# Patient Record
Sex: Male | Born: 1975
Health system: Southern US, Community
[De-identification: ages and names within clinical notes are randomized; demographics above are authoritative.]

## PROBLEM LIST (undated history)

## (undated) DIAGNOSIS — I4729 Other ventricular tachycardia (HCC): Principal | ICD-10-CM

## (undated) DIAGNOSIS — K529 Noninfective gastroenteritis and colitis, unspecified: Secondary | ICD-10-CM

## (undated) DIAGNOSIS — K5090 Crohn's disease, unspecified, without complications: Secondary | ICD-10-CM

## (undated) DIAGNOSIS — R739 Hyperglycemia, unspecified: Secondary | ICD-10-CM

## (undated) DIAGNOSIS — T380X5A Adverse effect of glucocorticoids and synthetic analogues, initial encounter: Secondary | ICD-10-CM

## (undated) DIAGNOSIS — E119 Type 2 diabetes mellitus without complications: Secondary | ICD-10-CM

## (undated) DIAGNOSIS — Z5189 Encounter for other specified aftercare: Secondary | ICD-10-CM

## (undated) HISTORY — DX: Crohn's disease, unspecified, without complications: K50.90

## (undated) HISTORY — DX: Type 2 diabetes mellitus without complications: E11.9

## (undated) HISTORY — DX: Noninfective gastroenteritis and colitis, unspecified: K52.9

## (undated) HISTORY — DX: Encounter for other specified aftercare: Z51.89

---

## 1998-01-14 HISTORY — PX: OTHER SURGICAL HISTORY: SHX169

## 2007-01-05 ENCOUNTER — Ambulatory Visit (HOSPITAL_BASED_OUTPATIENT_CLINIC_OR_DEPARTMENT_OTHER): Admission: RE | Admit: 2007-01-05 | Discharge: 2007-01-05 | Payer: Self-pay | Admitting: Surgery

## 2007-11-05 DIAGNOSIS — K5130 Ulcerative (chronic) rectosigmoiditis without complications: Secondary | ICD-10-CM | POA: Insufficient documentation

## 2007-11-06 ENCOUNTER — Ambulatory Visit: Payer: Self-pay | Admitting: Gastroenterology

## 2007-11-06 LAB — CONVERTED CEMR LAB
ALT: 15 units/L (ref 0–53)
AST: 17 units/L (ref 0–37)
Albumin: 3.3 g/dL — ABNORMAL LOW (ref 3.5–5.2)
Alkaline Phosphatase: 63 units/L (ref 39–117)
BUN: 9 mg/dL (ref 6–23)
Basophils Absolute: 0.1 10*3/uL (ref 0.0–0.1)
Basophils Relative: 1.4 % (ref 0.0–3.0)
CO2: 31 meq/L (ref 19–32)
Calcium: 8.5 mg/dL (ref 8.4–10.5)
Chloride: 107 meq/L (ref 96–112)
Creatinine, Ser: 0.9 mg/dL (ref 0.4–1.5)
Eosinophils Absolute: 0.8 10*3/uL — ABNORMAL HIGH (ref 0.0–0.7)
Eosinophils Relative: 14.2 % — ABNORMAL HIGH (ref 0.0–5.0)
GFR calc Af Amer: 127 mL/min
GFR calc non Af Amer: 105 mL/min
Glucose, Bld: 172 mg/dL — ABNORMAL HIGH (ref 70–99)
HCT: 35.6 % — ABNORMAL LOW (ref 39.0–52.0)
Hemoglobin: 12 g/dL — ABNORMAL LOW (ref 13.0–17.0)
Lymphocytes Relative: 32.5 % (ref 12.0–46.0)
MCHC: 33.8 g/dL (ref 30.0–36.0)
MCV: 82.7 fL (ref 78.0–100.0)
Monocytes Absolute: 0.6 10*3/uL (ref 0.1–1.0)
Monocytes Relative: 10.9 % (ref 3.0–12.0)
Neutro Abs: 2.2 10*3/uL (ref 1.4–7.7)
Neutrophils Relative %: 41 % — ABNORMAL LOW (ref 43.0–77.0)
Platelets: 304 10*3/uL (ref 150–400)
Potassium: 3.8 meq/L (ref 3.5–5.1)
RBC: 4.31 M/uL (ref 4.22–5.81)
RDW: 13.7 % (ref 11.5–14.6)
Sed Rate: 32 mm/hr — ABNORMAL HIGH (ref 0–16)
Sodium: 142 meq/L (ref 135–145)
Total Bilirubin: 0.6 mg/dL (ref 0.3–1.2)
Total Protein: 7.2 g/dL (ref 6.0–8.3)
WBC: 5.5 10*3/uL (ref 4.5–10.5)

## 2007-11-10 ENCOUNTER — Encounter: Payer: Self-pay | Admitting: Gastroenterology

## 2007-11-16 ENCOUNTER — Telehealth: Payer: Self-pay | Admitting: Gastroenterology

## 2007-11-24 ENCOUNTER — Telehealth: Payer: Self-pay | Admitting: Gastroenterology

## 2007-11-25 ENCOUNTER — Observation Stay (HOSPITAL_COMMUNITY)
Admission: AD | Admit: 2007-11-25 | Discharge: 2007-11-27 | Payer: Self-pay | Source: Home / Self Care | Admitting: Internal Medicine

## 2007-11-25 ENCOUNTER — Ambulatory Visit: Payer: Self-pay | Admitting: Gastroenterology

## 2007-11-25 ENCOUNTER — Ambulatory Visit: Payer: Self-pay | Admitting: Internal Medicine

## 2007-11-25 ENCOUNTER — Telehealth (INDEPENDENT_AMBULATORY_CARE_PROVIDER_SITE_OTHER): Payer: Self-pay | Admitting: *Deleted

## 2007-11-25 DIAGNOSIS — E1101 Type 2 diabetes mellitus with hyperosmolarity with coma: Secondary | ICD-10-CM | POA: Insufficient documentation

## 2007-11-25 LAB — CONVERTED CEMR LAB
ALT: 17 units/L (ref 0–53)
AST: 11 units/L (ref 0–37)
Albumin: 3.8 g/dL (ref 3.5–5.2)
Alkaline Phosphatase: 66 units/L (ref 39–117)
BUN: 21 mg/dL (ref 6–23)
Basophils Absolute: 0 10*3/uL (ref 0.0–0.1)
Basophils Relative: 0.3 % (ref 0.0–3.0)
Bilirubin, Direct: 0.1 mg/dL (ref 0.0–0.3)
CO2: 26 meq/L (ref 19–32)
Calcium: 8.8 mg/dL (ref 8.4–10.5)
Chloride: 96 meq/L (ref 96–112)
Creatinine, Ser: 1 mg/dL (ref 0.4–1.5)
Eosinophils Absolute: 0.2 10*3/uL (ref 0.0–0.7)
Eosinophils Relative: 2.7 % (ref 0.0–5.0)
GFR calc Af Amer: 113 mL/min
GFR calc non Af Amer: 93 mL/min
Glucose, Bld: 563 mg/dL (ref 70–99)
HCT: 39.1 % (ref 39.0–52.0)
Hemoglobin: 13.5 g/dL (ref 13.0–17.0)
Lymphocytes Relative: 30.8 % (ref 12.0–46.0)
MCHC: 34.5 g/dL (ref 30.0–36.0)
MCV: 83.6 fL (ref 78.0–100.0)
Monocytes Absolute: 0.7 10*3/uL (ref 0.1–1.0)
Monocytes Relative: 9.1 % (ref 3.0–12.0)
Neutro Abs: 4.4 10*3/uL (ref 1.4–7.7)
Neutrophils Relative %: 57.1 % (ref 43.0–77.0)
Platelets: 260 10*3/uL (ref 150–400)
Potassium: 3.9 meq/L (ref 3.5–5.1)
RBC: 4.67 M/uL (ref 4.22–5.81)
RDW: 15.3 % — ABNORMAL HIGH (ref 11.5–14.6)
Sodium: 133 meq/L — ABNORMAL LOW (ref 135–145)
Total Bilirubin: 1.5 mg/dL — ABNORMAL HIGH (ref 0.3–1.2)
Total Protein: 7.4 g/dL (ref 6.0–8.3)
WBC: 7.7 10*3/uL (ref 4.5–10.5)

## 2007-12-04 ENCOUNTER — Ambulatory Visit: Payer: Self-pay | Admitting: Gastroenterology

## 2007-12-04 DIAGNOSIS — E119 Type 2 diabetes mellitus without complications: Secondary | ICD-10-CM | POA: Insufficient documentation

## 2007-12-22 ENCOUNTER — Encounter: Payer: Self-pay | Admitting: Gastroenterology

## 2007-12-22 ENCOUNTER — Ambulatory Visit: Payer: Self-pay | Admitting: Gastroenterology

## 2007-12-25 ENCOUNTER — Encounter: Payer: Self-pay | Admitting: Gastroenterology

## 2008-01-04 ENCOUNTER — Telehealth: Payer: Self-pay | Admitting: Gastroenterology

## 2008-01-14 ENCOUNTER — Telehealth: Payer: Self-pay | Admitting: Gastroenterology

## 2008-01-15 ENCOUNTER — Telehealth: Payer: Self-pay | Admitting: Internal Medicine

## 2008-01-15 ENCOUNTER — Inpatient Hospital Stay (HOSPITAL_COMMUNITY): Admission: EM | Admit: 2008-01-15 | Discharge: 2008-01-17 | Payer: Self-pay | Admitting: Internal Medicine

## 2008-01-15 ENCOUNTER — Emergency Department (HOSPITAL_COMMUNITY): Admission: EM | Admit: 2008-01-15 | Discharge: 2008-01-15 | Payer: Self-pay | Admitting: Family Medicine

## 2008-01-15 ENCOUNTER — Ambulatory Visit: Payer: Self-pay | Admitting: Internal Medicine

## 2008-01-15 HISTORY — PX: HERNIA REPAIR: SHX51

## 2008-01-17 ENCOUNTER — Ambulatory Visit: Payer: Self-pay | Admitting: Internal Medicine

## 2008-01-19 ENCOUNTER — Telehealth (INDEPENDENT_AMBULATORY_CARE_PROVIDER_SITE_OTHER): Payer: Self-pay | Admitting: *Deleted

## 2008-01-19 ENCOUNTER — Telehealth: Payer: Self-pay | Admitting: Gastroenterology

## 2008-01-19 ENCOUNTER — Ambulatory Visit: Payer: Self-pay | Admitting: Gastroenterology

## 2008-01-20 LAB — CONVERTED CEMR LAB
ALT: 892 units/L — ABNORMAL HIGH (ref 0–53)
AST: 482 units/L — ABNORMAL HIGH (ref 0–37)
Albumin: 3 g/dL — ABNORMAL LOW (ref 3.5–5.2)
Alkaline Phosphatase: 476 units/L — ABNORMAL HIGH (ref 39–117)
BUN: 8 mg/dL (ref 6–23)
Bilirubin, Direct: 3.7 mg/dL — ABNORMAL HIGH (ref 0.0–0.3)
CO2: 26 meq/L (ref 19–32)
Calcium: 8.5 mg/dL (ref 8.4–10.5)
Chloride: 99 meq/L (ref 96–112)
Creatinine, Ser: 0.8 mg/dL (ref 0.4–1.5)
GFR calc Af Amer: 145 mL/min
GFR calc non Af Amer: 120 mL/min
Glucose, Bld: 124 mg/dL — ABNORMAL HIGH (ref 70–99)
HCT: 34 % — ABNORMAL LOW (ref 39.0–52.0)
Hemoglobin: 11.6 g/dL — ABNORMAL LOW (ref 13.0–17.0)
INR: 1 (ref 0.8–1.0)
MCHC: 34 g/dL (ref 30.0–36.0)
MCV: 89.5 fL (ref 78.0–100.0)
Platelets: 279 10*3/uL (ref 150–400)
Potassium: 3.8 meq/L (ref 3.5–5.1)
Prothrombin Time: 10.8 s — ABNORMAL LOW (ref 10.9–13.3)
RBC: 3.8 M/uL — ABNORMAL LOW (ref 4.22–5.81)
RDW: 18.9 % — ABNORMAL HIGH (ref 11.5–14.6)
Sodium: 136 meq/L (ref 135–145)
Total Bilirubin: 5.9 mg/dL — ABNORMAL HIGH (ref 0.3–1.2)
Total Protein: 6.1 g/dL (ref 6.0–8.3)
WBC: 6.6 10*3/uL (ref 4.5–10.5)

## 2008-01-22 ENCOUNTER — Ambulatory Visit: Payer: Self-pay | Admitting: Gastroenterology

## 2008-01-26 ENCOUNTER — Ambulatory Visit: Payer: Self-pay | Admitting: Gastroenterology

## 2008-01-26 LAB — CONVERTED CEMR LAB
ALT: 157 units/L — ABNORMAL HIGH (ref 0–53)
AST: 27 units/L (ref 0–37)
Albumin: 3.4 g/dL — ABNORMAL LOW (ref 3.5–5.2)
Alkaline Phosphatase: 231 units/L — ABNORMAL HIGH (ref 39–117)
BUN: 16 mg/dL (ref 6–23)
Basophils Absolute: 0.1 10*3/uL (ref 0.0–0.1)
Basophils Relative: 0.8 % (ref 0.0–3.0)
CO2: 30 meq/L (ref 19–32)
Calcium: 9.1 mg/dL (ref 8.4–10.5)
Chloride: 105 meq/L (ref 96–112)
Creatinine, Ser: 0.7 mg/dL (ref 0.4–1.5)
Eosinophils Absolute: 0 10*3/uL (ref 0.0–0.7)
Eosinophils Relative: 0.1 % (ref 0.0–5.0)
GFR calc Af Amer: 169 mL/min
GFR calc non Af Amer: 140 mL/min
Glucose, Bld: 154 mg/dL — ABNORMAL HIGH (ref 70–99)
HCT: 34 % — ABNORMAL LOW (ref 39.0–52.0)
Hemoglobin: 11.5 g/dL — ABNORMAL LOW (ref 13.0–17.0)
Lymphocytes Relative: 32.7 % (ref 12.0–46.0)
MCHC: 33.8 g/dL (ref 30.0–36.0)
MCV: 91.6 fL (ref 78.0–100.0)
Monocytes Absolute: 1.3 10*3/uL — ABNORMAL HIGH (ref 0.1–1.0)
Monocytes Relative: 15.8 % — ABNORMAL HIGH (ref 3.0–12.0)
Neutro Abs: 4 10*3/uL (ref 1.4–7.7)
Neutrophils Relative %: 50.6 % (ref 43.0–77.0)
Platelets: 360 10*3/uL (ref 150–400)
Potassium: 4 meq/L (ref 3.5–5.1)
RBC: 3.71 M/uL — ABNORMAL LOW (ref 4.22–5.81)
RDW: 18 % — ABNORMAL HIGH (ref 11.5–14.6)
Sodium: 140 meq/L (ref 135–145)
Total Bilirubin: 1.9 mg/dL — ABNORMAL HIGH (ref 0.3–1.2)
Total Protein: 6.7 g/dL (ref 6.0–8.3)
WBC: 8 10*3/uL (ref 4.5–10.5)

## 2008-02-03 ENCOUNTER — Telehealth (INDEPENDENT_AMBULATORY_CARE_PROVIDER_SITE_OTHER): Payer: Self-pay | Admitting: *Deleted

## 2008-02-04 ENCOUNTER — Ambulatory Visit: Payer: Self-pay | Admitting: Gastroenterology

## 2008-02-04 ENCOUNTER — Telehealth: Payer: Self-pay | Admitting: Gastroenterology

## 2008-02-08 ENCOUNTER — Ambulatory Visit: Payer: Self-pay | Admitting: Gastroenterology

## 2008-02-08 ENCOUNTER — Telehealth: Payer: Self-pay | Admitting: Gastroenterology

## 2008-02-09 LAB — CONVERTED CEMR LAB
ALT: 36 units/L (ref 0–53)
AST: 29 units/L (ref 0–37)
Albumin: 3.8 g/dL (ref 3.5–5.2)
Alkaline Phosphatase: 94 units/L (ref 39–117)
Bilirubin, Direct: 0.4 mg/dL — ABNORMAL HIGH (ref 0.0–0.3)
Total Bilirubin: 1.2 mg/dL (ref 0.3–1.2)
Total Protein: 6.9 g/dL (ref 6.0–8.3)

## 2008-02-23 ENCOUNTER — Ambulatory Visit: Payer: Self-pay | Admitting: Gastroenterology

## 2008-03-07 ENCOUNTER — Telehealth: Payer: Self-pay | Admitting: Internal Medicine

## 2008-03-22 ENCOUNTER — Encounter (INDEPENDENT_AMBULATORY_CARE_PROVIDER_SITE_OTHER): Payer: Self-pay | Admitting: *Deleted

## 2008-05-20 ENCOUNTER — Ambulatory Visit: Payer: Self-pay | Admitting: Gastroenterology

## 2008-05-20 ENCOUNTER — Telehealth: Payer: Self-pay | Admitting: Gastroenterology

## 2008-05-23 LAB — CONVERTED CEMR LAB
ALT: 16 units/L (ref 0–53)
AST: 14 units/L (ref 0–37)
Albumin: 2.9 g/dL — ABNORMAL LOW (ref 3.5–5.2)
Alkaline Phosphatase: 64 units/L (ref 39–117)
BUN: 13 mg/dL (ref 6–23)
Basophils Absolute: 0 10*3/uL (ref 0.0–0.1)
Basophils Relative: 0.7 % (ref 0.0–3.0)
CO2: 30 meq/L (ref 19–32)
Calcium: 8.2 mg/dL — ABNORMAL LOW (ref 8.4–10.5)
Chloride: 104 meq/L (ref 96–112)
Creatinine, Ser: 0.8 mg/dL (ref 0.4–1.5)
Eosinophils Absolute: 1.1 10*3/uL — ABNORMAL HIGH (ref 0.0–0.7)
Eosinophils Relative: 17.9 % — ABNORMAL HIGH (ref 0.0–5.0)
GFR calc non Af Amer: 119.58 mL/min (ref >60)
Glucose, Bld: 153 mg/dL — ABNORMAL HIGH (ref 70–99)
HCT: 25.7 % — ABNORMAL LOW (ref 39.0–52.0)
Hemoglobin: 8.4 g/dL — ABNORMAL LOW (ref 13.0–17.0)
Lymphocytes Relative: 31.5 % (ref 12.0–46.0)
Lymphs Abs: 1.9 10*3/uL (ref 0.7–4.0)
MCHC: 32.7 g/dL (ref 30.0–36.0)
MCV: 80.9 fL (ref 78.0–100.0)
Monocytes Absolute: 1 10*3/uL (ref 0.1–1.0)
Monocytes Relative: 16 % — ABNORMAL HIGH (ref 3.0–12.0)
Neutro Abs: 2 10*3/uL (ref 1.4–7.7)
Neutrophils Relative %: 33.9 % — ABNORMAL LOW (ref 43.0–77.0)
Platelets: 396 10*3/uL (ref 150.0–400.0)
Potassium: 3.8 meq/L (ref 3.5–5.1)
RBC: 3.18 M/uL — ABNORMAL LOW (ref 4.22–5.81)
RDW: 13.2 % (ref 11.5–14.6)
Sodium: 140 meq/L (ref 135–145)
Total Bilirubin: 0.5 mg/dL (ref 0.3–1.2)
Total Protein: 7 g/dL (ref 6.0–8.3)
WBC: 6 10*3/uL (ref 4.5–10.5)

## 2008-06-20 ENCOUNTER — Ambulatory Visit: Payer: Self-pay | Admitting: Gastroenterology

## 2008-06-22 ENCOUNTER — Telehealth: Payer: Self-pay | Admitting: Gastroenterology

## 2008-09-27 ENCOUNTER — Ambulatory Visit: Payer: Self-pay | Admitting: Gastroenterology

## 2008-09-30 ENCOUNTER — Telehealth: Payer: Self-pay | Admitting: Gastroenterology

## 2008-11-07 ENCOUNTER — Encounter (INDEPENDENT_AMBULATORY_CARE_PROVIDER_SITE_OTHER): Payer: Self-pay | Admitting: *Deleted

## 2009-01-02 ENCOUNTER — Encounter: Payer: Self-pay | Admitting: Gastroenterology

## 2009-01-31 ENCOUNTER — Ambulatory Visit: Payer: Self-pay | Admitting: Gastroenterology

## 2010-02-13 NOTE — Progress Notes (Signed)
Summary: RX SIDE EFFECT  Phone Note Call from Patient Call back at Home Phone [phone]   Caller: Patient Call For: JACOBS Reason for Call: Talk to Nurse Action Taken: Appt Scheduled Today Summary of Call: PT [place].... Initial call taken by: Ronalee Red,  November 24, 2007 9:39 AM  Follow-up for Phone Call        Pt. has colon on Friday but is again expressing concerns about side effects of Prednisone which was decreased on 11/16/07 to 10 mg. two times a day.from 20 mg.bid.Still has vision changes and has wt. loss of 14 lbs since ov on 11/06/07 Follow-up by: Abel Presto RN,  November 24, 2007 10:48 AM  Additional Follow-up for Phone Call Additional follow up Details #1::        back down a bit more (to 23m in am, then 548min pm).  ask him about how his bowels are doing.  he needs to come in for labs (cbc, cmet) today or tomorrow. Additional Follow-up by: DaMilus BanisterD,  November 24, 2007 11:15 AM    Additional Follow-up for Phone Call Additional follow up Details #2::    Pt. instructed to decrease Prednisone dose and he will come for labs tommorrow per Dr.Jacobs Follow-up by: ChAbel PrestoN,  November 24, 2007 2:07 PM    Appended Document: RX SIDE EFFECT Pt.says he is having 3-4 formed stools a day and has seen only a slight increase in mucus in stools and no blood since he has been tapering Prednisone

## 2010-02-13 NOTE — Progress Notes (Signed)
Summary: Fever and feels sick  Phone Note Call from Patient Call back at Home Phone [phone]   Call For: Dr Ardis Hughs Reason for Call: Talk to Nurse Summary of Call: Has a fever and feels sick would like to speak to nurse please. Initial call taken by: Irwin Brakeman [facility],  January 04, 2008 12:39 PM  Follow-up for Phone Call        Pt. has felt "achy" the past 2-3 days started running temp.last pm.this am is 101.5.Having about 3 loose stools each am. is seeing sm. amt of brb. in stools.Denies pain or cramping.Has appt. with pcp for tomorrow morning Follow-up by: Abel Presto RN,  January 04, 2008 12:51 PM  Additional Follow-up for Phone Call Additional follow up Details #1::        fever is an unusual side effect of sulfasalazine.  Maybe the flu?  Tell him to have his PCP page me tomorrow if he does think the symptoms are GI related. Additional Follow-up by: Milus Banister MD,  January 04, 2008 2:34 PM    Additional Follow-up for Phone Call Additional follow up Details #2::    Pt. aware of DR.Jacobs f/u suggestions Follow-up by: Abel Presto RN,  January 04, 2008 2:46 PM

## 2010-02-13 NOTE — Progress Notes (Signed)
Summary: results  Phone Note Call from Patient Call back at Home Phone [phone]   Caller: Patient Call For: JACOBS Reason for Call: Lab or Test Results, Insurance Question Action Taken: Appt Scheduled Today Details for Reason: results Summary of Call: would like Lab results Initial call taken by: Quenton Fetter [facility],  February 08, 2008 3:18 PM    Additional Follow-up for Phone Call Additional follow up Details #2::    LFT's revd. with pt.and he was informed that if Dr.has any new orders I will call him back Follow-up by: Abel Presto RN,  February 08, 2008 4:30 PM

## 2010-02-13 NOTE — Assessment & Plan Note (Signed)
GI problem list: 1. 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  Mesalamine products did not seem to help very well.  Significant flare summer 2009 steroids started. Had extreme hyperglycemia on steroids requiring insulin.  Repeat colonoscopy December, 2009 showed inflammation moderate To 70 cm. He was started on sulfasalazine. Had drug rash, likely pneumonitis, LFT abnormalities ( transaminases up to almost 1000, bili up to 6).  Holding his medicines resulted in complete resolution of abnormal labs checked serially.  TPMT testing January, 2010 showed normal enzyme activity. February, 2010 tapered off all medicines and feels very well.  May, 2010: flaring of symptoms for the past month, He went to see a [place] herbalist, did not start the oral mesalamine that I recommended, restarted prednisone.   History of Present Illness Visit Type: Follow-up Visit Primary GI MD: Owens Loffler MD Primary Provider: Odis Luster, MD Chief Complaint: Follow-up Visit History of Present Illness:      I last saw Ostin 3 months ago. At that time he was going to begin oral mesalamine. He was also due to start cord enemas. He really didn't do either of those therapies. Seems like he stay on his 10 mg twice daily of prednisone for about 2 months and then started to taper down recently.   currently on 22m two times a day.  Was on 10 mg twice a day about 2 weeks ago.  Shingles developed left upper quadrant left back (started about a week ago).  Has been doing cortenemas the past few days.  Does these at night before bedtime.  Has been seeing a [place] for the past 2-3 months., drinking a tea of some sort that he believes is helping.  With all this, his bowels are "good":  no bleeding, intermittent diarrhea only but nothing severe.           Current Medications (verified): 1)  Multivitamins   Tabs (Multiple Vitamin) ..Marland Kitchen. 1 Tablet By Mouth Once Daily 2)  Onetouch Ultrasoft Lancets   Misc (Lancets) .... Use Up To Four Times A Day 3)  Prednisone 5 Mg Tabs (Prednisone) .... Take Two By Mouth Once Daily 4)  Lantus 100 Unit/ml Soln (Insulin Glargine) .... Take As Directed 5)  Novolog 100 Unit/ml Soln (Insulin Aspart) .... Take As Directed 6)  Cortenema 100 Mg/685mEnem (Hydrocortisone) ...Marland Kitchen 1 Rectally At Bedtime As Needed  Allergies (verified): 1)  ! * Avelox 2)  ! Sulfa  Vital Signs:  Patient profile:   [age]ear old male Height:      73 inches Weight:      166.8 pounds Pulse rate:   86 / minute Pulse rhythm:   regular BP sitting:   140 / 70  (right arm) Cuff size:   regular  Vitals Entered By: LeBernita BuffyMA ([place](September 27, 2008 2:16 PM)  Physical Exam  Additional Exam:  Constitutional: generally well appearing Psychiatric: alert and oriented times 3 Abdomen: soft, non-tender, non-distended, normal bowel sounds    Impression & Recommendations:  Problem # 1:  left-sided colitis ScArva Chafeeems to manage his disease on his own. I cannot fault him for seeing  [place] and he truly believes that his symptoms have improved since starting this she mixture her it indeed that may be the case. He seems very busy with his work and I think he feels overall good control as long as he is on steroids and perhaps that is part of his reluctance to change to oral mesalamine  products. He has also had significant medicine reactions and that undoubtedly contributes.  For and I recommended that he cut back on his prednisone by 2 and half milligrams per week. Explained that the cortenema could be used chronically and that may keep his colitis in remission.  he'll continue that nightly.  Patient Instructions: 1)  Continue backing off the prednisone by 2.43m a week. 2)  Continue the cortenemmas nightly. 3)  Return to see Dr. JArdis Hughsin 3 months, sooner if needed. 4)  The medication list was reviewed and reconciled.  All changed / newly prescribed medications were  explained.  A complete medication list was provided to the patient / caregiver.

## 2010-02-13 NOTE — Letter (Signed)
Summary: Office Visit Letter  [facility]  [address] [place], [place] [address]   Phone: [phone]  Fax: [phone]      November 07, 2008 MRN: [id number]   Kenneth [name] [address] [place], [place]  [address]   Dear [name],   According to our records, it is time for you to schedule a follow-up office visit with [place] in the month of December 2010.   At your convenience, please call [phone] (option #2)to schedule an office visit. If you have any questions, concerns, or feel that this letter is in error, we would appreciate your call.   Sincerely,  Milus Banister, M.D.   [facility] Gastroenterology Division [phone]

## 2010-02-13 NOTE — Progress Notes (Signed)
Summary: medication ?  Phone Note Call from Patient Call back at [facility] Phone [phone]   Caller: Patient Call For: Dr. Ardis Hughs Reason for Call: Talk to Nurse Details for Reason: medication ? Summary of Call: has ?'s regarding medication "Cortennema"... pt found this medication online and would like an rx for it Initial call taken by: Lucien Mons,  June 22, 2008 12:08 PM  Follow-up for Phone Call        spoke with pt he was looking at the internet and found [place] and was wondering if this would be an option for him.  Christian Mate CMA  June 22, 2008 1:12 PM  Additional Follow-up for Phone Call Additional follow up Details #1::        yes please call in cortenema, once nightly before bed, give him 30 with 6 refills Additional Follow-up by: Milus Banister MD,  June 22, 2008 4:59 PM    New/Updated Medications: CORTENEMA 100 MG/60ML ENEM (HYDROCORTISONE) 1 rectally at bedtime   Prescriptions: CORTENEMA 100 MG/60ML ENEM (HYDROCORTISONE) 1 rectally at bedtime  #30 x 6   Entered by:   Christian Mate CMA   Authorized by:   Milus Banister MD   Signed by:   Christian Mate CMA on 06/22/2008   Method used:   Electronically to        [facility] (retail)       [place], [place]  [address]       Ph: [phone]       Fax: [phone]   RxID:   [id number]

## 2010-02-13 NOTE — Progress Notes (Signed)
----   Converted from flag ---- ---- 11/25/2007 12:39 PM, Rachael Fee MD wrote: yes, he needs to be seen by one of our PCPs today.  Urgent add on for steroid induced hyperglycemia, possible underlying diabetes.  if you don't get anywhere on getting him sqeezed in ASAP let me know...page me  dj  ---- 11/25/2007 10:59 AM, Chales Abrahams CMA wrote: Dr Christella Hartigan the lab called with a critical Glucose of 563 for [place].  Do you want me to do anything? ------------------------------  Phone Note Outgoing Call   Call placed by: Chales Abrahams CMA,  November 25, 2007 12:47 PM Summary of Call: left message on machine to call back pt needs asap pcp appt could not get pt will call back.  also left message at [facility] for appt.  Follow-up for Phone Call        pt returned call he agrees to see PCP , I am waiting on a call from [facility] for appt will call him when I get the time. Chales Abrahams CMA  November 25, 2007 12:51 PM Follow-up by: Rachael Fee MD,  November 25, 2007 1:25 PM  Additional Follow-up for Phone Call Additional follow up Details #1::        ok, let me know Additional Follow-up by: Rachael Fee MD,  November 25, 2007 1:25 PM    Additional Follow-up for Phone Call Additional follow up Details #2::    Pt. ntfd. that Dr.Jones in [facility] will see the pt. today at 2:15 p.m. Follow-up by: Teryl Lucy RN,  November 25, 2007 1:00 PM

## 2010-02-13 NOTE — Letter (Signed)
Summary: Results Letter  [facility]  [place], [place] [address]   Phone: [phone]  Fax: [phone]        December 25, 2007 MRN: [id number]    Kenneth [name] [address] [place],   [address]    Dear Kenneth [name],  The biopsies taken during your recent colonoscopy showed chronic inflammation without any signs of infection or cancer.  You should continue to follow the recommnedations that we discussed at the time of your procedure.  Please feel free to call if you have any further questions or concerns.      Sincerely,  Milus Banister MD  This letter has been electronically signed by your physician.

## 2010-02-13 NOTE — Progress Notes (Signed)
Summary: ON CALL / NEW JAUNDICE  Phone Note Call from Patient   Caller: Patient Call For: DR JACOBS Details for Reason: JAUNDICE Summary of Call: CHART REVIEWED.  CALLED BY PATIENT RE JAUNDICE NOTICED PAST DAY OR TWO. NEW DIABETIC. SEEING DR JACOBS FOR  LEFT SIDED UC. HAS BEEN ON LOW DOSE PREDNISONE. RECENTLY STARTED ON SULFASALAZINE. NO ACTIVE COLITIS SYMPTOMS. RECENTLY DEVELOPED PNEUMONIA AND WAS TREATED WITH AVELOX. DEVELOPED A RASH YESTERDAY. RASH WORSE TODAY. SWITCHED TO BIAXIN BY PCP.  HAS BEEN HAVING FEVERS. USES TYLENOL. OTHERWISE FEELS OK. NO PAIN, LETHERGY, OR CHANGE IN MENTAL STATUS. DR MOHAMAD (WIFE IS HIS NURSE) AT HOUSE AN CONFIRMS PATIENT IS JAUNDICED, BUT O/W LOOKS OK. I RECOMMENDED THAT HE GO TO [facility] FOR EVAL AND LABS. HE AGREES. Initial call taken by: Irene Shipper MD,  January 15, 2008 9:54 PM  Follow-up for Phone Call        SPOKE TO PA IN ER. PATIENT LOOKS JAUNDICED BUT O/W FINE W/ NORMAL VITALS AND NEURO EXAM. TRANSAMINASES 500 - 1000 RANGE. BILI 5.6. PT IS OK. ACUTE HEP PANEL SENT.   SUSPECT DRUG REACTION WITH RASH, FEVER, AND DRUG INDUCED HEPATITIS. TOLD TO STOP ALL ANTIBIOTICS AND SULFASALAZINE. TOLD TO COME TO OUR OFFICE EARLY AM 01-18-08 FOR REPEAT LABS. ALSO, TOLD TO CONTACT ME OVER THE WEEKEND IF ANY NEW OR WORSENING PROBLEMS.  PCP IS DR Maylon Peppers. Follow-up by: Irene Shipper MD,  January 15, 2008 10:02 PM  Additional Follow-up for Phone Call Additional follow up Details #1::        CHERYL, [place] LFT'S, PT/INR FIRST THING1-4-10. PLEASE ARRANGE AND FORWARD RESULTS TO DR JACOBS. Additional Follow-up by: Irene Shipper MD,  January 15, 2008 10:04 PM      Appended Document: ON CALL / [place]. NO NEED TO PUT IN LABS FOR 01-18-08.  Appended Document: ON CALL / [facility] admission noted

## 2010-02-13 NOTE — Assessment & Plan Note (Signed)
GI problem list: 1. 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  Mesalamine products did not seem to help very well.  Significant flare summer 2009 steroids started. Had extreme hyperglycemia on steroids requiring insulin.  Repeat colonoscopy December, 2009 showed inflammation moderate To 70 cm. He was started on sulfasalazine. Had drug rash, likely pneumonitis, LFT abnormalities ( transaminases up to almost 1000, bili up to 6).  Holding his medicines resulted in complete resolution of abnormal labs checked serially.  TPMT testing January, 2010 showed normal enzyme activity. February, 2010 tapered off all medicines and feels very well.  May, 2010: flaring of symptoms for the past month, started mesalamine orally and rectally, restarted prednisone.    History of Present Illness Visit Type: Follow-up Visit Primary GI MD: Owens Loffler MD Primary Aryon Nham: Odis Luster, MD Chief Complaint: follow-up visit History of Present Illness:     very pleasant 35 year old man who was last seen about one month ago.  he picked up the canasa suppositories and read it over, was nervous about the sulfa.  He did not start them or the asacol.  his hemoglobin last month was 8.4 and he therefore started iron supplements daily. He has noticed a great increase in his energy level, color of skin is more normal.  he is taking pednisone 52m twice a day.  Quickly he had blood sugar issues.  Since starting steroids, bleeding has stopped.  Still has mild urgency, has 2-3 BMs in AM.           Current Medications (verified): 1)  Multivitamins   Tabs (Multiple Vitamin) ..Marland Kitchen. 1 Tablet By Mouth Once Daily 2)  Onetouch Ultrasoft Lancets  Misc (Lancets) .... Use Up To Four Times A Day 3)  Prednisone 10 Mg Tabs (Prednisone) .... Take As Directed 4)  Lantus 100 Unit/ml Soln (Insulin Glargine) .... Take As Directed 5)  Novolog 100 Unit/ml Soln (Insulin Aspart) .... Take As Directed  Allergies  (verified): 1)  ! * Avelox 2)  ! Sulfa  Vital Signs:  Patient profile:   35year old male Height:      73 inches Weight:      163 pounds BMI:     21.58 Pulse rate:   84 / minute Pulse rhythm:   regular BP sitting:   126 / 70  (left arm)  Vitals Entered By: BRandye LoboCMA (June 20, 2008 9:33 AM)  Physical Exam  Additional Exam:  Constitutional: generally well appearing Psychiatric: alert and oriented times 3 Abdomen: soft, non-tender, non-distended, normal bowel sounds    Impression & Recommendations:  Problem # 1:  left-sided colitis he was nervous about having side effects from the Canasa suppositories as well as the asacol however I discussed with him the fact that he had been on mesalamine previously under the care of Dr. GPenelope Coopand never had problems with mesalamine. Think it is reasonable however to change him to rectal steroid suppositories. He will on the other hand begin taking the oral mesalamine. She will continue on prednisone 20 mg a day and will return to see me in one months time.  Patient Instructions: 1)  Return to see Dr. JArdis Hughsin 4-5 weeks. 2)  Start the Asacol. 3)  Will call in streoid suppositories. 4)  Continue prednisone at 10 twice daily until next appt. 5)  The medication list was reviewed and reconciled.  All changed / newly prescribed medications were explained.  A complete medication list was provided to the patient /  caregiver.  Appended Document:  please call [place], the only steroid  suppositories avail are really just for hemorhroids.  he can either strat the canasa suppositories (i think this is safe) or simply NOT do suppositories (probably ok since he's already better with the steroids.  Appended Document:  pt aware

## 2010-02-13 NOTE — Miscellaneous (Signed)
Summary: rx  Clinical Lists Changes  Medications: Added new medication of SULFAZINE 500 MG  TABS (SULFASALAZINE) take 3 pills, three times a day - Signed Rx of SULFAZINE 500 MG  TABS (SULFASALAZINE) take 3 pills, three times a day;  #270 x 3;  Signed;  Entered by: Milus Banister MD;  Authorized by: Milus Banister MD;  Method used: Electronically to [facility] [address], [place], [place], [place]  [address], Ph: [phone], Fax: [phone]    Prescriptions: SULFAZINE 500 MG  TABS (SULFASALAZINE) take 3 pills, three times a day  #270 x 3   Entered and Authorized by:   Milus Banister MD   Signed by:   Milus Banister MD on 12/22/2007   Method used:   Electronically to        [facility] (retail)       [place], [place]  [address]       Ph: [phone]       Fax: [phone]   RxID:   [id number]

## 2010-02-13 NOTE — Progress Notes (Signed)
Summary: rx called in  Phone Note Call from Patient Call back at Home Phone [phone]   Caller: Patient Call For: Kenneth [name] Reason for Call: Talk to Nurse Summary of Call: Patient would like pain meds called in for shingles to [facility] on [address]  Initial call taken by: Ronalee Red,  September 30, 2008 10:00 AM  Follow-up for Phone Call        pt was called and explained that he needs to call his primary care Dr for pain meds for Shingles.  pt agreed.  Follow-up by: Christian Mate CMA ([place]),  September 30, 2008 10:06 AM

## 2010-02-13 NOTE — Progress Notes (Signed)
Summary: talk to nurse  Phone Note Call from Patient Call back at Home Phone [phone]   Call For: Dr Ardis Hughs Reason for Call: Talk to Nurse Summary of Call: Wants to discuss his Steroids with nurse. Initial call taken by: Irwin Brakeman [facility],  January 14, 2008 10:49 AM  Follow-up for Phone Call        Pt. was diagnosed with pneumonia on 01/04/08 and says I just can't shake  it.Colitis is under control not having any pain or bleeding asking if he can start to taper Prednisone is on 10 mg. b.i.d and thinks that is why his pneumonia isn't getting better.Has f/u appt. with Dr.Jacobs on 01/22/08 Follow-up by: Abel Presto RN,  January 14, 2008 11:15 AM  Additional Follow-up for Phone Call Additional follow up Details #1::        if he has any concerns regarding pneumonia, he should see his PCP ASAP. He could decrease his prednisone 10 mg daily. He should keep his followup with Dr. Ardis Hughs next week. Additional Follow-up by: Irene Shipper MD,  January 14, 2008 11:47 AM    Additional Follow-up for Phone Call Additional follow up Details #2::    Pt. ntfd. of DR.Perry's orders Follow-up by: Abel Presto RN,  January 14, 2008 11:56 AM

## 2010-02-13 NOTE — Progress Notes (Signed)
Summary: results  Phone Note Call from Patient Call back at Home Phone [phone]   Caller: Patient Call For: JACOBS  Reason for Call: Lab or Test Results Details for Reason: results  Summary of Call: wondering if Lab results were in yet? Initial call taken by: Quenton Fetter [facility],  January 19, 2008 4:44 PM  Follow-up for Phone Call        Lab results not back yet.Pt.aware will call pt. when complete and rev'd by Dr.Jacobs Follow-up by: Abel Presto RN,  January 19, 2008 5:01 PM      Appended Document: results message left to cb for lab results  Appended Document: results Pt. ntfd. of lab results

## 2010-02-13 NOTE — Procedures (Signed)
Summary: colon prep/[facility]  colon prep/[facility]   Imported By: Bubba Hales 11/13/2007 08:53:59  _____________________________________________________________________  External Attachment:    Type:   Image     Comment:   External Document

## 2010-02-13 NOTE — Progress Notes (Signed)
Summary: SPEAK TO NURSE  Phone Note Call from Patient Call back at Home Phone [phone]   Caller: Patient Call For: JACOBS Reason for Call: Talk to Nurse Summary of Call: PT TAKING [place] EFFECT Initial call taken by: Ronalee Red,  November 16, 2007 9:34 AM  Follow-up for Phone Call        Is on Prednisone20 mg. two times a day since 11/06/07 which started working almost immediately. Is having formed b.m.'s with some mucus but no blood.Denies pain.Is concerned about his vision is having trouble focusing far away( any thing past 10 feet ) Follow-up by: Abel Presto RN,  November 16, 2007 10:44 AM  Additional Follow-up for Phone Call Additional follow up Details #1::        probably from prednisone, he should cut back to 71m twice a day, otherwise continue as discussed. Additional Follow-up by: DMilus BanisterMD,  November 16, 2007 11:03 AM    Additional Follow-up for Phone Call Additional follow up Details #2::    Pt. ntfd. of Dr.Jacobs orders to decrease Prednisone reminded to call if symptoms re- occur Follow-up by: CAbel PrestoRN,  November 16, 2007 12:11 PM

## 2010-02-13 NOTE — Letter (Signed)
Summary: Recall-Office Visit Letter  [facility]  [place], [place] [address]   Phone: [phone]  Fax: [phone]      March 22, 2008 MRN: [id number]   [facility] [facility] [address] [place], [place]  [address]   Dear Mr. Boehringer,   According to our records, it is time for you to schedule a follow-up office visit with [place] in the month of April 2010.   At your convenience, please call [phone] (option #2)to schedule an office visit. If you have any questions, concerns, or feel that this letter is in error, we would appreciate your call.   Sincerely,  Milus Banister, M.D.   [facility] Gastroenterology Division [phone]

## 2010-02-13 NOTE — Assessment & Plan Note (Signed)
GI problem list: 1. 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  Mesalamine products did not seem to help very well.  Significant flare summer 2009 steroids started. Had extreme hyperglycemia on steroids requiring insulin.  Repeat colonoscopy December, 2009 showed inflammation moderate To 70 cm. He was started on sulfasalazine. Had drug rash, likely pneumonitis, LFT abnormalities ( transaminases up to almost 1000, bili up to 6).  Holding his medicines resulted in complete resolution of abnormal labs checked serially.  TPMT testing January, 2010 showed normal enzyme activity. February, 2010 tapered off all medicines and feels very well.  May, 2010: flaring of symptoms for the past month, He went to see a [place] herbalist, did not start the oral mesalamine that I recommended, restarted prednisone.  September, 2010 self-medicating with prednisone however tapering off. January, 2011, : doing well off traditional IBD medicines, taking [place] Herb.    History of Present Illness Visit Type: Follow-up Visit Primary GI MD: Owens Loffler MD Primary Provider: Odis Luster, MD Chief Complaint: ulcerative colitis History of Present Illness:     last seen 4-5 months ago. his bowels have been "great," almost like normal.  have 1-2 BMs a day.  solid, non-bloody.  No abd piains.  No diarrhea.  he has been on Aloe and a herbal Tea once daily.  Not using the enemas at all.  No steroids in several months.  the only thing he is conerned about.           Current Medications (verified): 1)  Multivitamins   Tabs (Multiple Vitamin) .Marland Kitchen.. 1 Tablet By Mouth Once Daily 2)  Onetouch Ultrasoft Lancets  Misc (Lancets) .... Use Up To Four Times A Day  Allergies (verified): 1)  ! * Avelox 2)  ! Sulfa  Vital Signs:  Patient profile:   35 year old male Height:      73 inches Weight:      167.50 pounds BMI:     22.18 Pulse rate:   72 / minute Pulse rhythm:   regular BP sitting:   114 / 70   (left arm) Cuff size:   regular  Vitals Entered By: June McMurray [place] Deborra Medina) (January 31, 2009 11:06 AM)  Physical Exam  Additional Exam:  Constitutional: generally well appearing Psychiatric: alert and oriented times 3 Abdomen: soft, non-tender, non-distended, normal bowel sounds    Impression & Recommendations:  Problem # 1:  Left-sided colitis symptomatically he sounds like he is doing quite well. He is on nutritional colitis  medicines, however he is taking shallow and a [place] herbal tea on a daily basis. It is not clear if these therapies are helping him or if he is just in cyclical remission that is not atypical of colitis. He will return to see me on an as-needed basis and he will forward his recent iron studies for my review. Does sound as if he was iron deficient and he started taking iron supplements for a while, now he is on multivitamin with iron in them.  Patient Instructions: 1)  Will get labs records from Dr. Conception Chancy, from [facility].  [place]. 2)  Return to see Dr. Ardis Hughs as needed. 3)  The medication list was reviewed and reconciled.  All changed / newly prescribed medications were explained.  A complete medication list was provided to the patient / caregiver.  Appended Document:  recieved faxed labs from [facility] dated 01/03/09: Hb 12.1, mcv normal but iron studies all low.  Please call him, I  agree with him being on daily iron supplements.  Appended Document:  left message on machine to call back   Appended Document:  pt aware

## 2010-02-13 NOTE — Assessment & Plan Note (Signed)
  GI problem list: 1. 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  Mesalamine products did not seem to help very well.  Significant flare summer 2009 steroids started. Had extreme hyperglycemia on steroids requiring insulin.     History of Present Illness Visit Type: follow up Primary GI MD: Owens Loffler MD Primary Shantea Poulton: Odis Luster, MD Chief Complaint: follow-up visit, hosp and colitis History of Present Illness:     vision back to normal.  Urinating much less, gaining weight back.  Saw Dr. Buddy Duty endocrinologist this morning.  This may have been purely a steroid problem.  Currently 15 prednisone a day, bowel doing well.  Forgot his 5 mg dose twice and saw a bit of blood the next.             Prior Medications Reviewed Using: Patient Recall  Updated Prior Medication List: MULTIVITAMINS   TABS (MULTIPLE VITAMIN) 1 tablet by mouth once daily PREDNISONE 10 MG  TABS (PREDNISONE) take one tab in AM, take 1/2 tab (65m) in PM LANTUS SOLOSTAR 100 UNIT/ML SOLN (INSULIN GLARGINE) 12 units in AM NOVOLOG 100 UNIT/ML SOLN (INSULIN ASPART) 4 units with meals  Current Allergies (reviewed today): No known allergies       Vital Signs:  Patient Profile:   34Years Old Male Height:     73 inches Weight:      155 pounds BMI:     20.52 Pulse rate:   72 / minute Pulse rhythm:   regular BP sitting:   120 / 64  (left arm) Cuff size:   regular  Vitals Entered By: SAbelino DerrickCMA (December 04, 2007 11:10 AM)                  Physical Exam  Constitutional: generally well appearing Psychiatric: alert and oriented times 3 Abdomen: soft, non-tender, non-distended, normal bowel sounds     Impression & Recommendations:  Problem # 1:  COLITIS (ICD-558.9) Colitis symptoms under good control however when he cuts back from 15-10 mg of prednisone, inadvertently, he notices some bleeding. He should stay on this regimen for now. He has done some  reading on the Internet about sulfasalazine and is interested in trying this after we get a good look at his colon by colonoscopy in the next few weeks. I am certainly agreeable to trying that medicine.  Problem # 2:  DIABETES unclear if he has underlying glucose metabolism problems, I think it is likely. He is currently seeing a endocrinologist who feels this may simply be steroid related. We will work closely with his endocrinologist as we taper his steroids which will not begin until at least a month or so after starting sulfasalazine or whatever product we decide to use after his upcoming colonoscopy.   Patient Instructions: 1)  You will be scheduled to have a colonoscopy in very early December.  Keep steroid dosing the same. 2)  Call if any troubles prior to then.    ]  Appended Document: Orders Update/movi pt already has prep at home not sent today   Clinical Lists Changes  Orders: Added new Test order of Colonoscopy (Colon) - Signed

## 2010-02-13 NOTE — Progress Notes (Signed)
Summary: lancet refill  Phone Note Refill Request Message from:  Fax from Pharmacy on March 07, 2008 8:24 AM  Refills Requested: Medication #1:  ONETOUCH ULTRASOFT LANCETS  MISC Use up to four times a day.    New/Updated Medications: ONETOUCH ULTRASOFT LANCETS  MISC (LANCETS) Use up to four times a day   Prescriptions: ONETOUCH ULTRASOFT LANCETS  MISC (LANCETS) Use up to four times a day  #100 x 6   Entered by:   Rock Nephew CMA   Authorized by:   Etta Grandchild MD   Signed by:   Rock Nephew CMA on 03/07/2008   Method used:   Electronically to        [facility] (retail)       [address]       [place], [place]  [address]       Ph: [phone]       Fax: [phone]   RxID:   [id number]

## 2010-02-13 NOTE — Progress Notes (Signed)
  Phone Note From Other Clinic   Summary of Call: Louis from [facility] called to say that test was cx. because specimen was hemolyzed when rec'd.Says a letter was sent to you.(TPMT Phenotype) Initial call taken by: Abel Presto RN,  February 03, 2008 12:00 PM  Follow-up for Phone Call        i never got a letter.  this test needs to be repeated Follow-up by: Milus Banister MD,  February 03, 2008 1:58 PM  Additional Follow-up for Phone Call Additional follow up Details #1::        left message on machine to call back [place]  February 04, 2008 8:25 AM  spoke with pt he will come in today to have labs done [place]  February 04, 2008 8:51 AM

## 2010-02-13 NOTE — Assessment & Plan Note (Signed)
History of Present Illness Visit Type: self referral Primary GI MD: Owens Loffler MD Primary Provider: Maylon Peppers, MD Chief Complaint: Colitis History of Present Illness:     Very pleasant 35 year old man who shortly after stopping smoking he Had rectal beeding, urgency, diarrhea started in August 2006.  Worsened, finally evaluated by Dr. Penelope Coop.  Colonoscopy showed 30cm of affected distal colon.  Asacol started, didn't help and so switched to lialda...this helped even less.  May not have taken it as advised. He was off of medicines and the symptoms stopped on their own.  Off of all meds for about 18 months, doing well.  This past summer, all symptoms returned but with more severitiy.  At worst 10 times a day, bleeding, urgency.  Getting up in middle of night.  Went back to [place], steroids started and he improved pretty quickly. Started on apriso 4 pills a day.  has been on that for 4 months.  Symptoms recurred and are worse than ever.  No rashes, no lumps, bumps anywhere.  No sore joints.             Prior Medications Reviewed Using: Patient Recall  Updated Prior Medication List: APRISO 0.375 GM XR24H-CAP (MESALAMINE) 4 pills daily MULTIVITAMINS   TABS (MULTIPLE VITAMIN) 1 tablet by mouth once daily  Current Allergies (reviewed today): No known allergies   Past Medical History:    left-sided ulcerative colitis, diagnosed 2007, inflammation up to 30 cm from anus biopsies confirmed active and chronic colitis      Past Surgical History:    thumb    hernia surgery 12 2008   Family History:    no Crohn's or colitis and family, no colon cancer family  Social History:    he is married, his wife works with Dr. Julien Nordmann at the [facility], he is a successful artist working in aluminum usually, quit smoking in 2006, drinks about one alcoholic beverage a day, next one to 2 caffeinated beverages a day.    Review of Systems       Pertinent positive and negative review of  systems were noted in the above HPI and GI specific review of systems.  All other review of systems was otherwise negative.    Vital Signs:  Patient Profile:   35 Years Old Male Height:     73 inches Weight:      164 pounds BMI:     21.72 BSA:     1.98 Pulse rate:   70 / minute Pulse rhythm:   regular BP sitting:   136 / 56  (left arm)  Vitals Entered By: Bethel Born CMA (November 06, 2007 2:51 PM)                  Physical Exam  Constitutional: generally well appearing Psychiatric: alert and oriented times 3 Eyes: extraocular movements intact Mouth: oropharynx moist, no lesions Neck: supple, no lymphadenopathy Cardiovascular: heart regular rate and rythm Lungs: CTA bilaterally Abdomen: soft, non-tender, non-distended, no obvious ascites, no peritoneal signs, normal bowel sounds Extremities: no lower extremity edema bilaterally Skin: no lesions on visible extremities     Impression & Recommendations:  Problem # 1:  COLITIS (ICD-558.9) 30 cm of distal colitis seen in 2007. My biggest question is has his disease progressed since then. We should certainly repeat colonoscopy at his soonest convenience. For now I will start him on prednisone 20 mg twice daily and will call this into his pharmacy. He will stay on that dose  at least until the time of his colonoscopy. I've advised him to stop taking his mesalamine product. He'll get a basic set of labs today including a CBC, complete metabolic profile, TSH, sedimentation rate, stool testing especially given his recent trip to [place]. Orders: TLB-CBC Platelet - w/Differential (85025-CBCD) TLB-CMP (Comprehensive Metabolic Pnl) (82956-OZHY) TLB-Sedimentation Rate (ESR) (85651-ESR) T-Culture, Stool (87045/87046-70140) T-Culture, C-Diff Toxin A/B (86578-46962) T-Stool for O&P (95284-13244) T-Stool Giardia / Crypto- EIA (01027)    Patient Instructions: 1)  You will be scheduled to have a colonoscopy in next 2-3  weeks. 2)  Start prednisone 51m twice a day. 3)  You will get lab test(s) done today (cbc, cmet, esr, stool testing). 4)  A copy of this information will be sent to Dr. KMaylon Peppers 5)  Crohn's colitis [facility] [place].    Prescriptions: PREDNISONE 10 MG  TABS (PREDNISONE) take 2 pills twice a day  #120 x 3   Entered and Authorized by:   DMilus BanisterMD   Signed by:   DMilus BanisterMD on 11/06/2007   Method used:   Print then Give to Patient   RxID:   [id number] ]  Appended Document: Orders Update/movi    Clinical Lists Changes  Medications: Added new medication of MOVIPREP 100 GM  SOLR (PEG-KCL-NACL-NASULF-NA ASC-C) As per prep instructions. - Signed Rx of MOVIPREP 100 GM  SOLR (PEG-KCL-NACL-NASULF-NA ASC-C) As per prep instructions.;  #1 x 0;  Signed;  Entered by: PChristian MateCMA;  Authorized by: DMilus BanisterMD;  Method used: [facility] [address] [place]  [address] Ph: [phone] Fax: [phone]: Added new Test order of ZCOL (ZCOL) - Signed    Prescriptions: MOVIPREP 100 GM  SOLR (PEG-KCL-NACL-NASULF-NA ASC-C) As per prep instructions.  #1 x 0   Entered by:   PChristian MateCMA   Authorized by:   DMilus BanisterMD   Signed by:   PChristian MateCMA on 11/06/2007   Method used:   [facility]ically to        [facility] ([facility]ail)       [address] [place]  [address]      Ph: [phone]      Fax: [phone]  RxID:   [id number]

## 2010-02-13 NOTE — Progress Notes (Signed)
Summary: lab orders  Phone Note From Other Clinic   Summary of Call: pT. IS DOWSTAIRS IN OUR LAB SAYING HE WAS TOLD TO COME FOR LABS BY THE DR. Vestavia Hills.hAS APPT. WITH YOU ON 01/22/08.THERE IS NO DISCHARGE SUMMARY IN e CHART Initial call taken by: Abel Presto RN,  January 19, 2008 11:43 AM  Follow-up for Phone Call        he needs cbc, lfts, bmet, inr Follow-up by: Milus Banister MD,  January 19, 2008 1:09 PM  Additional Follow-up for Phone Call Additional follow up Details #1::        Oders put in [place] Additional Follow-up by: Abel Presto RN,  January 19, 2008 1:24 PM

## 2010-02-13 NOTE — Assessment & Plan Note (Signed)
GI problem list: 1. 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  Mesalamine products did not seem to help very well.  Significant flare summer 2009 steroids started. Had extreme hyperglycemia on steroids requiring insulin.  Repeat colonoscopy December, 2009 showed inflammation moderate To 70 cm. He was started on sulfasalazine. Had drug rash, likely pneumonitis, LFT abnormalities ( transaminases up to almost 1000, bili up to 6).  Holding his medicines resulted in complete resolution of abnormal labs checked serially.  TPMT testing January, 2010 showed normal enzyme activity. February, 2010 tapered off all medicines and feels very well.  May, 2010: flaring of symptoms for the past month, started mesalamine orally and rectally, restarted prednisone.    History of Present Illness Visit Type: Follow-up Visit Primary GI MD: Owens Loffler MD Primary Keeleigh Terris: Odis Luster, MD Chief Complaint: diarrhea, abdominal pain History of Present Illness:     very pleasant 35 year old man who I last saw about 3 months ago in the office. His colitis at that time is causing no symptoms. He wanted to try to remain off of medicines for a while.   in last month, bloody diarrhea has returned.  Will go 3-4 times in AM, 2 during day, 1-2 at night.  A lot of urgency, most of the time.  No eye symtoms, no rashes, lumps.  he feels fatigued.  he has lost 13 pounds since his last visit, most of this has been the last month.  Has not started any meds.        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 1 Tablet By Mouth Once Daily 2)  Onetouch Ultrasoft Lancets  Misc (Lancets) .... Use Up To Four Times A Day  Allergies (verified): 1)  ! * Avelox 2)  ! Sulfa  Vital Signs:  Patient profile:   35 year old male Height:      73 inches Weight:      153 pounds BMI:      20.26 Pulse rate:   100 / minute Pulse rhythm:   regular BP sitting:   120 / 66  (left arm) Cuff size:   regular  Vitals Entered By: Abelino Derrick CMA (May 20, 2008 3:41 PM)  Physical Exam  Additional Exam:  Constitutional: generally well appearing Psychiatric: alert and oriented times 3 Abdomen: soft,very mildly tender throughout, non-distended, normal bowel sounds    Impression & Recommendations:  Problem # 1:  COLITIS (ICD-558.9) his colitis is flaring again. I recommended he reconsider immunomodulators therapies however he wants to give high-dose mesalamine products orally and rectally a real tried first. I will therefore put him on asacoll 800 mg HD 2 pills 3 times a day and Rowasa enemas once nightly. He'll begin taking 10 mg of prednisone twice daily. He knows he'll have to likely get back on insulin again. He will call his endocrinologist to alert them. He'll return to see me in 4 weeks and sooner if needed. He'll get a basic set of labs including a CBC and complete metabolic profile.  Other Orders: TLB-CBC Platelet - w/Differential (85025-CBCD) TLB-CMP (Comprehensive Metabolic Pnl) (65035-WSFK)  Patient Instructions: 1)  You will get lab test(s) done today (cbc, cmet). 2)  You should start prednisone 15ms twice a day. 3)  You should start asacol 8066mpills, two pills three times. 4)  You  should start rowasa enemas once nightly. 5)  Return to see Dr. Ardis Hughs in 4-5 weeks. 6)  The medication list was reviewed and reconciled.  All changed / newly prescribed medications were explained.  A complete medication list was provided to the patient / caregiver. Prescriptions: ROWASA 4 GM KIT (MESALAMINE-CLEANSER) one enema nightly  #28 x 3   Entered by:   Christian Mate CMA   Authorized by:   Milus Banister MD   Signed by:   Christian Mate CMA on 05/20/2008   Method used:   Electronically to        [facility] (retail)       [place], [place]  [address]       Ph: [phone]       Fax: [phone]   RxID:   [id number] ASACOL HD 800 MG TBEC (MESALAMINE) 2 by mouth three times a day  #180 x 3   Entered by:   Christian Mate CMA   Authorized by:   Milus Banister MD   Signed by:   Christian Mate CMA on 05/20/2008   Method used:   Electronically to        [facility] (retail)       [place], [place]  [address]       Ph: [phone]       Fax: [phone]   RxID:   [id number]

## 2010-02-13 NOTE — Progress Notes (Signed)
Summary: Ask a question  Phone Note Call from Patient Call back at Home Phone [phone]   Call For: Dr.Siana Panameno Reason for Call: Talk to Nurse Summary of Call: Would like to ask nurse a question. Initial call taken by: Irwin Brakeman [facility],  May 20, 2008 8:20 AM  Follow-up for Phone Call        lm for patient to return call. Georgianne Fick, RN  May 20, 2008 8:45 AM Patient is having diarrhea llq pain. bloating.  Did have some steroids left but was able to give him an appointment instead.  Denies fever blood in stool.  Will see Dr Ardis Hughs at 330 today.  will order chart and send to patty lewis Follow-up by: Georgianne Fick, RN,  May 20, 2008 8:58 AM

## 2010-02-13 NOTE — Assessment & Plan Note (Signed)
Summary: ref by Dr Olen Pel 563 and wanted pt seen today   Vital Signs:  Patient Profile:   35 Years Old Male Height:     73 inches Weight:      149 pounds Temp:     97.9 degrees F oral Pulse rate:   64 / minute Pulse rhythm:   regular BP sitting:   120 / 84  (right arm) Cuff size:   regular  Vitals Entered By: Estell Harpin CMA (November 25, 2007 2:13 PM)                 PCP:  Maylon Peppers, MD  Chief Complaint:  elevated blood sugar per Dr Ardis Hughs.  History of Present Illness: This is a new pt. to me for evaluation of BS=563 done today by GI as part of a work-up for colitis. Pt. has lost 16 pounds in 2 weeks with blurred vision, polyuria, polydipsia, and polyphagia.    Updated Prior Medication List: MULTIVITAMINS   TABS (MULTIPLE VITAMIN) 1 tablet by mouth once daily PREDNISONE 10 MG  TABS (PREDNISONE) take 2 pills twice a day  Current Allergies (reviewed today): No known allergies   Past Medical History:    Reviewed history from 11/06/2007 and no changes required:       left-sided ulcerative colitis, diagnosed 2007, inflammation up to 30 cm from anus biopsies confirmed active and chronic colitis         Past Surgical History:    Reviewed history from 11/06/2007 and no changes required:       thumb       hernia surgery 12 2008   Family History:    Reviewed history from 11/06/2007 and no changes required:       no Crohn's or colitis and family, no colon cancer family  Social History:    Reviewed history from 11/06/2007 and no changes required:       he is married, his wife works with Dr. Julien Nordmann at the [facility], he is a successful artist working in aluminum usually, quit smoking in 2006, drinks about one alcoholic beverage a day, next one to 2 caffeinated beverages a day.   Risk Factors:  Tobacco use:  never Passive smoke exposure:  no Drug use:  no HIV high-risk behavior:  no Alcohol use:  no Exercise:  yes  Family History Risk Factors:  Family History of MI in females < 48 years old:  no    Family History of MI in males < 76 years old:  no   Review of Systems       The patient complains of weight loss and vision loss.  The patient denies anorexia, fever, weight gain, decreased hearing, chest pain, syncope, dyspnea on exertion, peripheral edema, prolonged cough, headaches, hemoptysis, abdominal pain, melena, severe indigestion/heartburn, hematuria, incontinence, suspicious skin lesions, depression, abnormal bleeding, and enlarged lymph nodes.     Physical Exam  General:     alert, well-developed, well-nourished, well-hydrated, and appropriate dress.   Eyes:     No corneal or conjunctival inflammation noted. EOMI. Perrla. Funduscopic exam benign, without hemorrhages, exudates or papilledema. Vision grossly normal. Mouth:     xerostomia.   Neck:     supple, full ROM, and no masses.   Lungs:     Normal respiratory effort, chest expands symmetrically. Lungs are clear to auscultation, no crackles or wheezes. Heart:     Normal rate and regular rhythm. S1 and S2 normal without gallop, murmur, click, rub or other extra sounds.  Abdomen:     soft, non-tender, normal bowel sounds, no distention, no masses, no guarding, no rigidity, no rebound tenderness, no abdominal hernia, no inguinal hernia, no hepatomegaly, and no splenomegaly.   Msk:     normal ROM and no joint tenderness.   Extremities:     No clubbing, cyanosis, edema, or deformity noted with normal full range of motion of all joints.   Skin:     turgor normal, color normal, no rashes, no ulcerations, and no edema.   Psych:     Cognition and judgment appear intact. Alert and cooperative with normal attention span and concentration. 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 1 tablet by mouth once daily 2)  Prednisone 10 Mg Tabs (Prednisone) .... Take 2 pills twice a day    ]  Appended Document: ref by Dr Olen Pel 563 and wanted pt seen today As billing provider, I have reviewed this document.

## 2010-02-13 NOTE — Progress Notes (Signed)
Summary: lab results-whenever they are available  Phone Note Call from Patient Call back at Home Phone [phone]   Caller: Patient Call For: DR JACOBS Reason for Call: Talk to Nurse Details for Reason: RESULTS  Summary of Call: Lab results--2nd request  Initial call taken by: Irwin Brakeman [facility],  February 04, 2008 2:36 PM  Follow-up for Phone Call        No results of LFT's in computer as of yet.I checked with Sheri in the lab and they missed drawing them.Pt. will  come back in to have blood drawn at no charge Follow-up by: Abel Presto RN,  February 05, 2008 4:00 PM

## 2010-02-13 NOTE — Miscellaneous (Signed)
Summary: Waiver of [facility]  Waiver of [employer]   Imported By: Bubba Hales 11/13/2007 08:51:31  _____________________________________________________________________  External Attachment:    Type:   Image     Comment:   External Document

## 2010-02-18 IMAGING — CR DG CHEST 2V
2 series · 2 of 2 positions shown · non-contrast
Comparison: None

CLINICAL DATA: Fever, cough for 2 weeks

CHEST - 2 VIEW

[w chest pa]
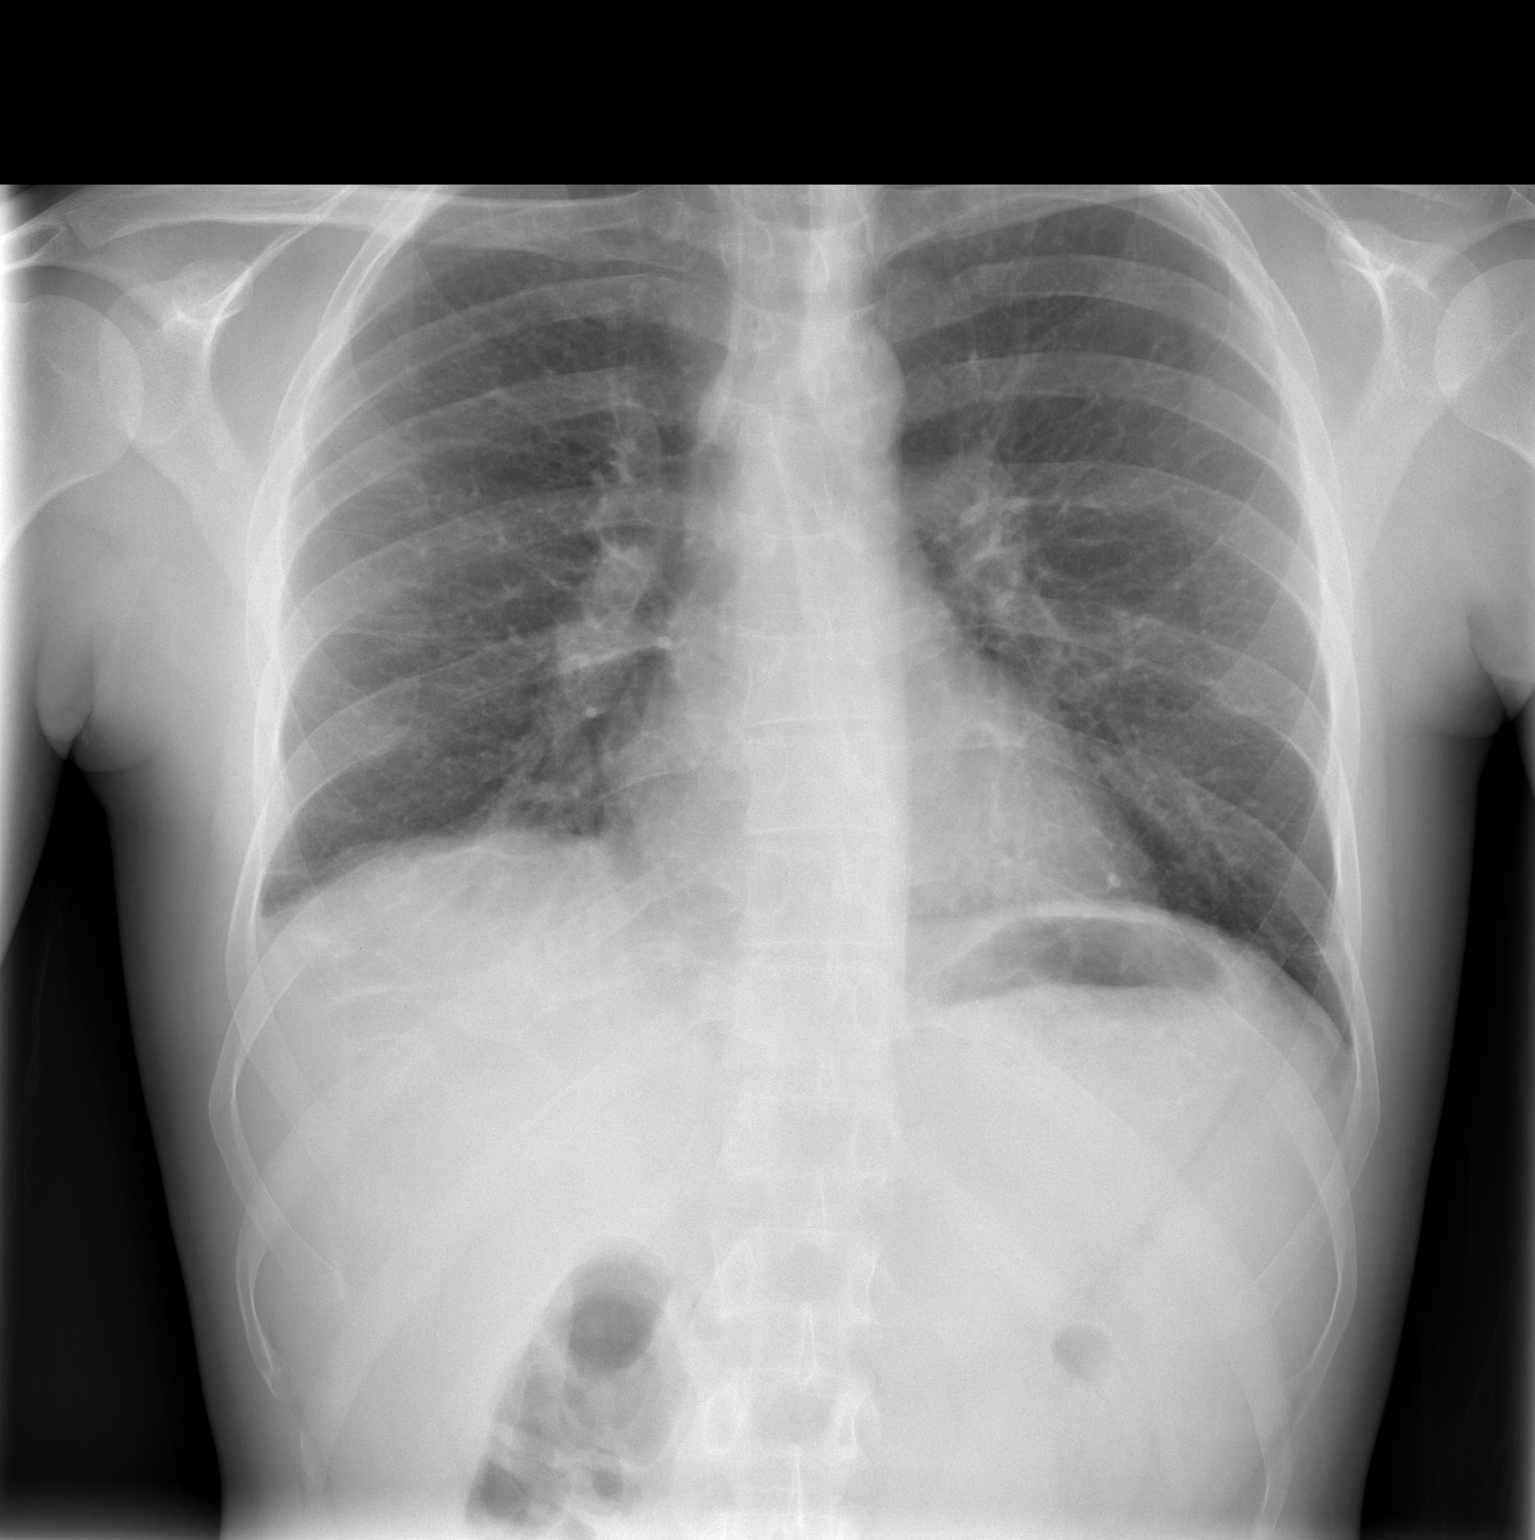

[w chest lat]
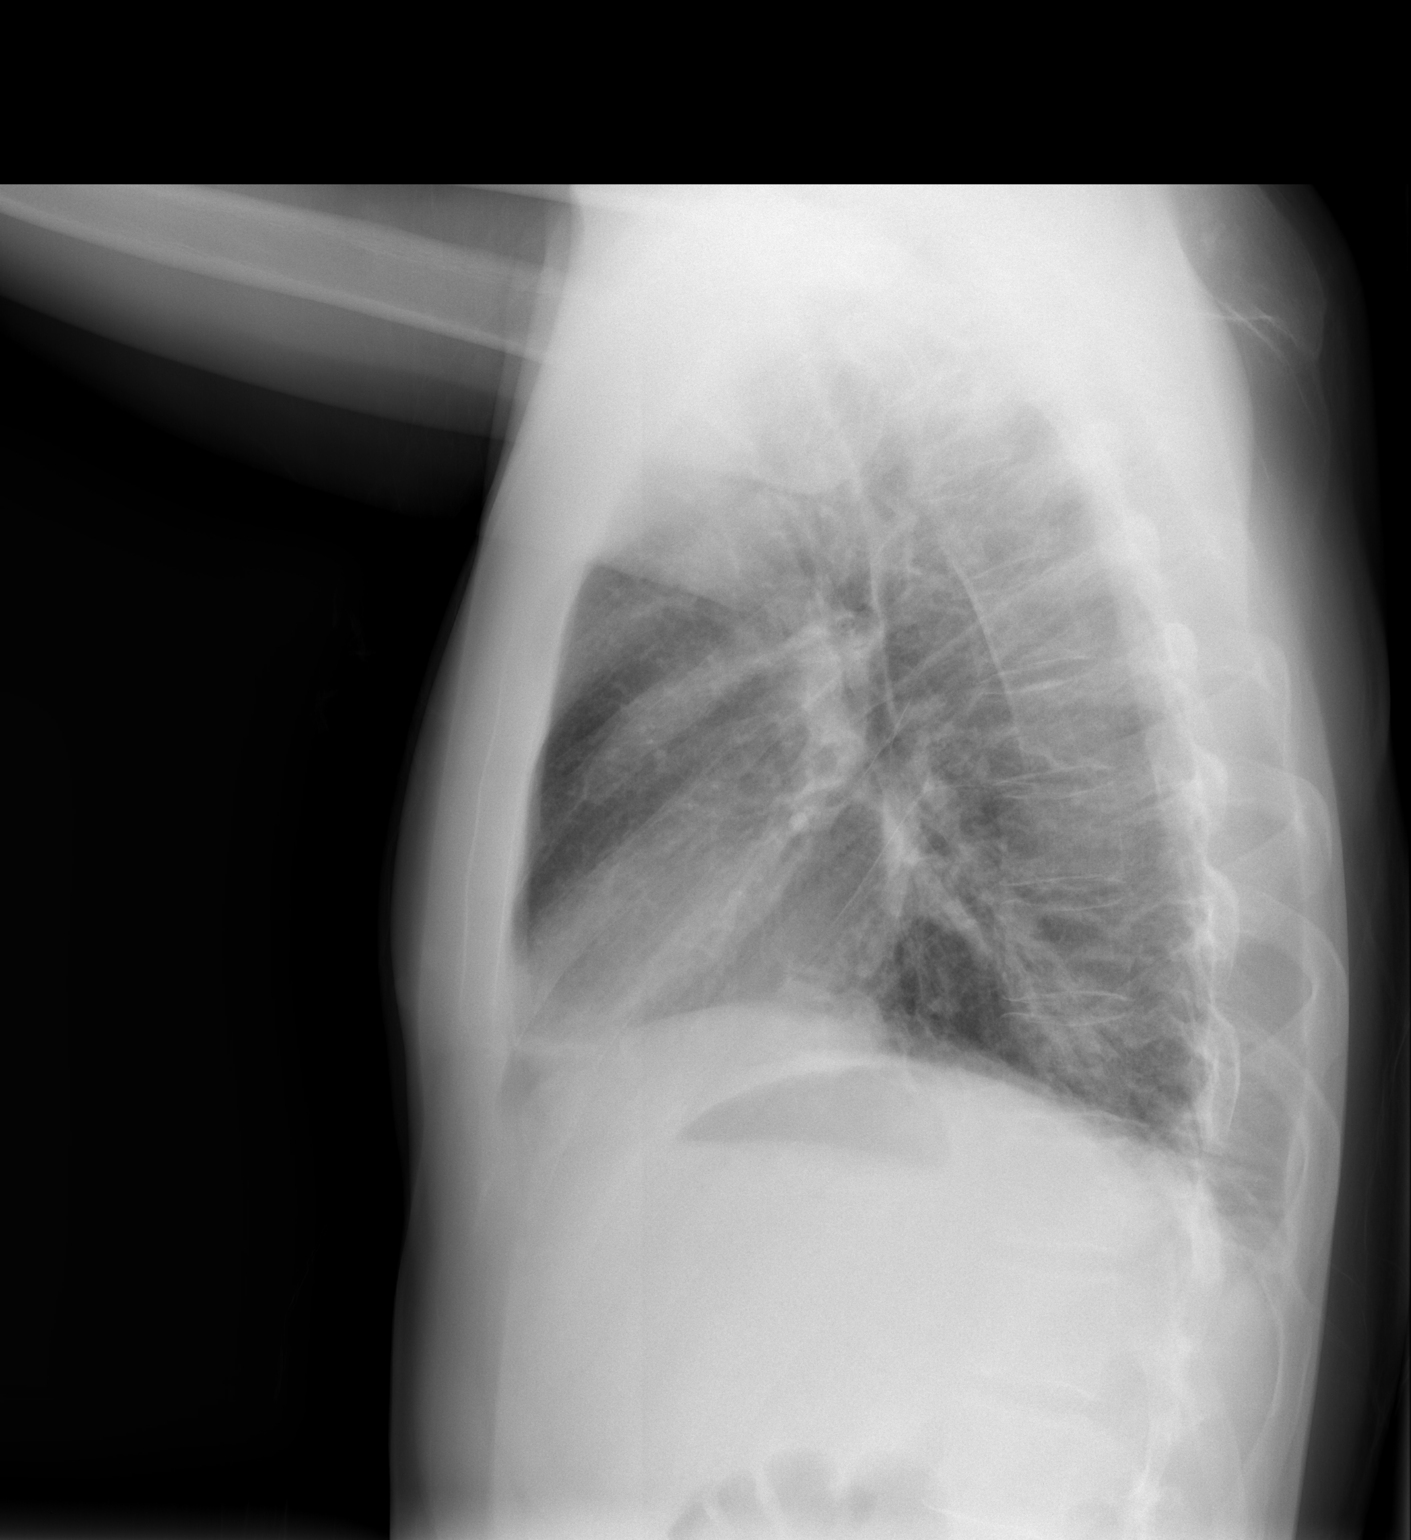

[2 of 2 positions shown; findings below may reference images not displayed]

FINDINGS: There are linear opacities at the posterior right lung
base which may reflect atelectasis or  pneumonia. Follow-up chest x-
ray is recommended.  No focal infiltrate or effusion is seen.  The
heart is within normal limits in size.  No bony abnormality is
noted.
IMPRESSION: Linear opacities at the right lung base may reflect atelectasis or
pneumonia .  No focal infiltrate or effusion is seen. Recommend
follow-up chest x-ray.

## 2010-03-12 ENCOUNTER — Inpatient Hospital Stay (INDEPENDENT_AMBULATORY_CARE_PROVIDER_SITE_OTHER)
Admission: RE | Admit: 2010-03-12 | Discharge: 2010-03-12 | Disposition: A | Discharge status: Home / Self Care | Payer: 59 | Source: Ambulatory Visit | Attending: Emergency Medicine | Admitting: Emergency Medicine

## 2010-03-12 ENCOUNTER — Ambulatory Visit (INDEPENDENT_AMBULATORY_CARE_PROVIDER_SITE_OTHER): Payer: 59

## 2010-03-12 DIAGNOSIS — M79609 Pain in unspecified limb: Secondary | ICD-10-CM

## 2010-04-30 LAB — DIFFERENTIAL
Basophils Absolute: 0 10*3/uL (ref 0.0–0.1)
Basophils Absolute: 0 10*3/uL (ref 0.0–0.1)
Basophils Relative: 0 % (ref 0–1)
Basophils Relative: 0 % (ref 0–1)
Eosinophils Absolute: 0 10*3/uL (ref 0.0–0.7)
Eosinophils Absolute: 0 10*3/uL (ref 0.0–0.7)
Eosinophils Relative: 0 % (ref 0–5)
Eosinophils Relative: 0 % (ref 0–5)
Lymphocytes Relative: 35 % (ref 12–46)
Lymphocytes Relative: 43 % (ref 12–46)
Lymphs Abs: 2.5 10*3/uL (ref 0.7–4.0)
Lymphs Abs: 2.7 10*3/uL (ref 0.7–4.0)
Monocytes Absolute: 0.5 10*3/uL (ref 0.1–1.0)
Monocytes Absolute: 0.6 10*3/uL (ref 0.1–1.0)
Monocytes Relative: 7 % (ref 3–12)
Monocytes Relative: 9 % (ref 3–12)
Neutro Abs: 4 10*3/uL (ref 1.7–7.7)
Neutro Abs: 2.9 10*3/uL (ref 1.7–7.7)
Neutrophils Relative %: 58 % (ref 43–77)
Neutrophils Relative %: 48 % (ref 43–77)

## 2010-04-30 LAB — GLUCOSE, CAPILLARY
Glucose-Capillary: 103 mg/dL — ABNORMAL HIGH (ref 70–99)
Glucose-Capillary: 105 mg/dL — ABNORMAL HIGH (ref 70–99)
Glucose-Capillary: 113 mg/dL — ABNORMAL HIGH (ref 70–99)
Glucose-Capillary: 123 mg/dL — ABNORMAL HIGH (ref 70–99)
Glucose-Capillary: 132 mg/dL — ABNORMAL HIGH (ref 70–99)
Glucose-Capillary: 152 mg/dL — ABNORMAL HIGH (ref 70–99)
Glucose-Capillary: 85 mg/dL (ref 70–99)

## 2010-04-30 LAB — CBC
HCT: 33.8 % — ABNORMAL LOW (ref 39.0–52.0)
HCT: 28.6 % — ABNORMAL LOW (ref 39.0–52.0)
Hemoglobin: 11 g/dL — ABNORMAL LOW (ref 13.0–17.0)
Hemoglobin: 9.5 g/dL — ABNORMAL LOW (ref 13.0–17.0)
MCHC: 32.6 g/dL (ref 30.0–36.0)
MCHC: 33.4 g/dL (ref 30.0–36.0)
MCV: 90.3 fL (ref 78.0–100.0)
MCV: 89.4 fL (ref 78.0–100.0)
Platelets: 203 10*3/uL (ref 150–400)
Platelets: 183 10*3/uL (ref 150–400)
RBC: 3.74 MIL/uL — ABNORMAL LOW (ref 4.22–5.81)
RBC: 3.19 MIL/uL — ABNORMAL LOW (ref 4.22–5.81)
RDW: 19.2 % — ABNORMAL HIGH (ref 11.5–15.5)
RDW: 19.1 % — ABNORMAL HIGH (ref 11.5–15.5)
WBC: 7 10*3/uL (ref 4.0–10.5)
WBC: 6.2 10*3/uL (ref 4.0–10.5)

## 2010-04-30 LAB — HEPATIC FUNCTION PANEL
ALT: 911 U/L — ABNORMAL HIGH (ref 0–53)
AST: 798 U/L — ABNORMAL HIGH (ref 0–37)
Albumin: 2.8 g/dL — ABNORMAL LOW (ref 3.5–5.2)
Alkaline Phosphatase: 397 U/L — ABNORMAL HIGH (ref 39–117)
Bilirubin, Direct: 4.2 mg/dL — ABNORMAL HIGH (ref 0.0–0.3)
Indirect Bilirubin: 2.4 mg/dL — ABNORMAL HIGH (ref 0.3–0.9)
Total Bilirubin: 6.6 mg/dL — ABNORMAL HIGH (ref 0.3–1.2)
Total Protein: 5.4 g/dL — ABNORMAL LOW (ref 6.0–8.3)

## 2010-04-30 LAB — COMPREHENSIVE METABOLIC PANEL
ALT: 1046 U/L — ABNORMAL HIGH (ref 0–53)
ALT: 888 U/L — ABNORMAL HIGH (ref 0–53)
AST: 949 U/L — ABNORMAL HIGH (ref 0–37)
AST: 745 U/L — ABNORMAL HIGH (ref 0–37)
Albumin: 3.3 g/dL — ABNORMAL LOW (ref 3.5–5.2)
Albumin: 2.9 g/dL — ABNORMAL LOW (ref 3.5–5.2)
Alkaline Phosphatase: 361 U/L — ABNORMAL HIGH (ref 39–117)
Alkaline Phosphatase: 325 U/L — ABNORMAL HIGH (ref 39–117)
BUN: 10 mg/dL (ref 6–23)
BUN: 12 mg/dL (ref 6–23)
CO2: 24 mEq/L (ref 19–32)
CO2: 25 mEq/L (ref 19–32)
Calcium: 8.6 mg/dL (ref 8.4–10.5)
Calcium: 8 mg/dL — ABNORMAL LOW (ref 8.4–10.5)
Chloride: 100 mEq/L (ref 96–112)
Chloride: 99 mEq/L (ref 96–112)
Creatinine, Ser: 0.88 mg/dL (ref 0.4–1.5)
Creatinine, Ser: 0.88 mg/dL (ref 0.4–1.5)
GFR calc Af Amer: >60 mL/min (ref >60)
GFR calc Af Amer: >60 mL/min (ref >60)
GFR calc non Af Amer: >60 mL/min (ref >60)
GFR calc non Af Amer: >60 mL/min (ref >60)
Glucose, Bld: 143 mg/dL — ABNORMAL HIGH (ref 70–99)
Glucose, Bld: 100 mg/dL — ABNORMAL HIGH (ref 70–99)
Potassium: 3.7 mEq/L (ref 3.5–5.1)
Potassium: 3.8 mEq/L (ref 3.5–5.1)
Sodium: 135 mEq/L (ref 135–145)
Sodium: 134 mEq/L — ABNORMAL LOW (ref 135–145)
Total Bilirubin: 5.7 mg/dL — ABNORMAL HIGH (ref 0.3–1.2)
Total Bilirubin: 5.1 mg/dL — ABNORMAL HIGH (ref 0.3–1.2)
Total Protein: 6.6 g/dL (ref 6.0–8.3)
Total Protein: 5.4 g/dL — ABNORMAL LOW (ref 6.0–8.3)

## 2010-04-30 LAB — HEPATITIS PANEL, ACUTE
HCV Ab: NEGATIVE
HCV Ab: NEGATIVE
Hep A IgM: NEGATIVE
Hep A IgM: NEGATIVE
Hep B C IgM: NEGATIVE
Hep B C IgM: NEGATIVE
Hepatitis B Surface Ag: NEGATIVE
Hepatitis B Surface Ag: NEGATIVE

## 2010-04-30 LAB — PROTIME-INR
INR: 1.3 (ref 0.00–1.49)
INR: 1.2 (ref 0.00–1.49)
INR: 1.3 (ref 0.00–1.49)
Prothrombin Time: 16.8 seconds — ABNORMAL HIGH (ref 11.6–15.2)
Prothrombin Time: 15.4 seconds — ABNORMAL HIGH (ref 11.6–15.2)
Prothrombin Time: 16.4 seconds — ABNORMAL HIGH (ref 11.6–15.2)

## 2010-04-30 LAB — URINALYSIS, ROUTINE W REFLEX MICROSCOPIC
Glucose, UA: NEGATIVE mg/dL
Hgb urine dipstick: NEGATIVE
Ketones, ur: NEGATIVE mg/dL
Nitrite: NEGATIVE
Protein, ur: NEGATIVE mg/dL
Specific Gravity, Urine: 1.008 (ref 1.005–1.030)
Urobilinogen, UA: 4 mg/dL — ABNORMAL HIGH (ref 0.0–1.0)
pH: 6 (ref 5.0–8.0)

## 2010-04-30 LAB — APTT
aPTT: 41 seconds — ABNORMAL HIGH (ref 24–37)
aPTT: 40 seconds — ABNORMAL HIGH (ref 24–37)

## 2010-04-30 LAB — ACETAMINOPHEN LEVEL: Acetaminophen (Tylenol), Serum: <10 ug/mL — ABNORMAL LOW (ref 10–30)

## 2010-05-29 NOTE — H&P (Signed)
NAME:  Kenneth [name], Kenneth [name]                ACCOUNT NO.:  [id number]   MEDICAL RECORD NO.:  [id number]          PATIENT TYPE:  INP   LOCATION:  [place]                         FACILITY:  [facility]   PHYSICIAN:  Thayer Headings, MD    DATE OF BIRTH:  January 06, 1976   DATE OF ADMISSION:  01/15/2008  DATE OF DISCHARGE:                              HISTORY & PHYSICAL   PRIMARY CARE PHYSICIAN:  [facility].   CHIEF COMPLAINT:  Rash.   HISTORY OF PRESENT ILLNESS:  This is a 35 year old male with a history  of ulcerative colitis for 2 years on prednisone for 3 months and  sulfasalazine since September 22, 2007, who had about a 10 day history of  fever through about January 11, 2008 and was started on Avelox for  presumed pneumonia.  The patient subsequently 2 days later began to  develop a rash and presented to the PCP's office.  There, it was noted  that he had a pruritic erythematous rash.  On evaluation, the patient  also noted transaminitis.  It was noted that he had AST of 949, ALT of  1046 today along with the diffuse rash.  Otherwise, the patient has been  his normal self and has not had a fever today.  He he has a normal  appetite.  No nausea or vomiting today.   PAST MEDICAL HISTORY:  1. Ulcerative colitis.  2. Prednisone induced diabetes.   MEDICATIONS:  1. Prednisone 15 mg daily.  2. Lantus 10 units q.a.m.  3. Humalog with meals and   DRUG ALLERGIES:  New drug allergy is AVELOX which causes a rash and  hepatitis.   FAMILY HISTORY:  Noncontributory.   SOCIAL HISTORY:  The patient denies any tobacco or drugs including no  injecting drugs ever in his past.  He reports occasional alcohol use.   REVIEW OF SYSTEMS:  Negative except as per the history of present  illness.   PHYSICAL EXAMINATION:  VITAL SIGNS:  Temperature is 97.8, pulse 103,  respirations 18, blood pressure is 120/91, O2 sat is 95%.  GENERAL:  The patient is awake, alert, oriented x3, appears in no acute  distress.   HEENT:  Icteric.  CARDIOVASCULAR:  Regular rate and rhythm.  No murmurs, rubs or gallops.  LUNGS:  Clear to auscultation bilaterally.  ABDOMEN:  Soft, nontender, nondistended.  Positive bowel sounds.  No  hepatosplenomegaly.  EXTREMITIES:  No cyanosis, clubbing or edema.  SKIN:  Diffuse nonconfluent pruritic papillary erythematous rash,  essentially head to toe.   LABORATORY DATA:  WBC is 7, hemoglobin 11, platelets 203.  Differential  with 58% neutrophils and 0 eosinophils.  Sodium 135, potassium 3.7,  chloride 100, bicarb 24, BUN 10, creatinine 0.88, glucose 143, alk phos  361, AST 949, ALT 1046, total bilirubin is 5.7.   ASSESSMENT/PLAN:  1. Hepatitis likely drug-induced, temporally related to Avelox though      sulfasalazine certainly could be a cause as well.  At this time,      will hold Avelox.  Also will hold his sulfasalazine and have him  consider rechallenge with his gastroenterologist as an outpatient.      Will also to assure that it is not another etiology, do an acute      hepatitis panel.  2. Deep venous thrombosis prophylaxis.  With the liver issue at this      time, will hold off on any DVT prophylaxis.  We have provided him      pneumo boots and encourage ambulation.      Thayer Headings, MD  Electronically Signed     RWC/MEDQ  D:  01/15/2008  T:  01/16/2008  Job:  [id number]   cc:   [facility]

## 2010-05-29 NOTE — Op Note (Signed)
NAME:  Kenneth [name], Kenneth [name]                ACCOUNT NO.:  [id number]   MEDICAL RECORD NO.:  [id number]          PATIENT TYPE:  AMB   LOCATION:  [place]                          FACILITY:  [facility]   PHYSICIAN:  Imogene Burn. Georgette Dover, M.D. DATE OF BIRTH:  11-15-1975   DATE OF PROCEDURE:  01/05/2007  DATE OF DISCHARGE:                               OPERATIVE REPORT   PREOPERATIVE DIAGNOSIS:  Left inguinal hernia.   POSTOPERATIVE DIAGNOSIS:  Left indirect inguinal hernia.   PROCEDURE PERFORMED:  Left inguinal hernia repair with mesh.   SURGEON:  Imogene Burn. Georgette Dover, M.D., FACS   ANESTHESIA:  General via LMA.   INDICATIONS:  The patient is a 35 year old male who noticed a bulge in  his left groin about a month ago.  It has caused some discomfort.  On  examination he had a visible reducible left inguinal hernia.  No sign of  a right inguinal hernia.  We recommended surgical repair.   DESCRIPTION OF PROCEDURE:  The patient was brought to the operating room  and placed in supine position on the operating room table.  After an  adequate level of general anesthesia was obtained, the patient's left  groin was shaved, prepped with Betadine and draped in a sterile fashion.  A time-out was taken to assure the proper patient and proper procedure.  We infiltrated the area above the left inguinal ligament with 0.25%  Marcaine.  An oblique incision was made above the inguinal ligament.  Dissection was carried down to the external oblique fascia, which was  opened along the direction of its fibers down to the external ring.  The  Weitlaner retractor was used to provide exposure.  We bluntly dissected  around the spermatic cord.  The floor of the inguinal canal was intact  with no direct defect but did appear to be rather lax.  We skeletonized  the spermatic cord.  A moderate-sized indirect hernia sac and adherent  cord lipoma were dissected free from the spermatic cord and reduced up  to the internal ring.  The  internal ring was then tightened with a  figure-of-eight 2-0 Vicryl.  The floor of the inguinal canal was then  reinforced with a 0 PDS suture.  A 3x 6-inch Ultrapro mesh was cut into  a keyhole shape and secured beginning at the pubic tubercle with 2-0  Vicryl suture.  We used interrupted 2-0 Vicryls at the upper and lower  edges, attaching it to the shelving edge inferiorly and the internal  oblique fascia superiorly.  The tails were sutured together behind the  spermatic cord and tucked underneath the external oblique fascia.  The  fascia was reapproximated with 2-0 Vicryl.  Vicryl 3-0 and 4-0 Monocryl  were used to close the subcutaneous tissues and the skin.  Steri-Strips  and clean dressings were applied.  The patient was explained and brought  to the recovery room in stable condition.  All sponge, instrument and  needle counts were correct.      Imogene Burn. Tsuei, M.D.  Electronically Signed    MKT/MEDQ  D:  01/05/2007  T:  01/05/2007  Job:  [id number]

## 2010-05-29 NOTE — Discharge Summary (Signed)
NAME:  Kenneth [name], Kenneth [name]                ACCOUNT NO.:  [id number]   MEDICAL RECORD NO.:  [id number]          PATIENT TYPE:  INP   LOCATION:  3016                         FACILITY:  [place]:  Kathlene November, MD           DATE OF BIRTH:  [birth year]   DATE OF ADMISSION:  11/25/2007  DATE OF DISCHARGE:  11/27/2007                               DISCHARGE SUMMARY   PRIMARY CARE PHYSICIAN:  Candace Gallus, M.D. with [facility].   GASTROENTEROLOGIST:  Milus Banister, MD.   DISCHARGE DIAGNOSES:  1. New onset diabetes, uncontrolled.  2. Chronic colitis on chronic prednisone.   HISTORY OF PRESENT ILLNESS:  Kenneth [name] is a very pleasant 35 year old  white male with past medical history of left-sided ulcerative colitis  diagnosed in 2007 confirmed with biopsies, who presented to his  gastroenterologist on November 25, 2007 for a regularly scheduled exam.  However, blood sugar done in the gastroenterology office per routine  revealed blood sugar of 563.  The patient also admitted to recent weight  loss of 16 pounds in the past 2 weeks with blurred vision, polyuria,  polydipsia and polyphagia.  Due to the patient's findings, the patient  was sent to [facility] for further evaluation and  treatment at which time he was sent to the hospital to rule out DKA.  BMET done at time of admission was within normal limits.  However, blood  sugar obtained upon arrival to the hospital was 438.   PAST MEDICAL HISTORY:  1. Left-sided ulcerative colitis diagnosed 2007 followed by Dr. Oretha Caprice.  2. Hernia surgery December 2008.   HOSPITAL COURSE:  New onset diabetes, uncontrolled.  Again, the patient  admitted from the office to rule out DKA.  A1c obtained during this  admission 10.5%.  As mentioned above, the patient has been placed on  chronic prednisone approximately 3 weeks prior to this admission for his  colitis.  However, glucose checked at office visit to  gastroenterologist  in early October was 170 and with A1c as high as 10.5, the patient can  certainly be considered diabetic at this time.  Again, BMET has remained  stable throughout hospitalization with no signs of diabetic  ketoacidosis.  The patient and wife have undergone extensive teaching  per diabetic coordinator in regards to diabetic diet and insulin  administration.  We will discharge home on meds as listed below.  Have  established the patient to see Dr. Delrae Rend, endocrinologist at  [facility] 1 week post-discharge.   DISCHARGE MEDICATIONS:  1. Prednisone 50 mg p.o. daily.  2. Lantus insulin 10 units subcutaneous q.h.s.  3. NovoLog insulin 5 units subcutaneous t.i.d. with meals.  4. Multivitamin daily.   LABORATORY DATA:  Pertinent lab work at time of discharge:  White cell  count 5.9, platelet count 249, hemoglobin 12.4, hematocrit 37.0, BUN  133, sodium 133, potassium 3.4, BUN 13, creatinine 1.27, hemoglobin A1c  10.5, TSH 0.622.  Lipid profile revealed cholesterol 193, triglycerides  225, HDL 34,  LDL 114.  Urinalysis positive for greater than 80 ketones  and greater than 1,000 glucose.   DISPOSITION:  The patient felt medically stable for discharge home at  this time.   FOLLOW UP:  1. The patient is instructed to follow up with Dr. Delrae Rend at      [facility] December 04, 2007 at 8:45 a.m.  2. In addition, the patient is scheduled for follow up with Dr. Oretha Caprice on December 04, 2007 at 11:15.   Both patient and wife verify understanding of insulin regimen and are  asked to call the [facility] office or Dr. Oretha Caprice with any questions.      Patrici Ranks, NP      Kathlene November, MD  Electronically Signed    LE/MEDQ  D:  11/27/2007  T:  11/27/2007  Job:  [id number]   cc:   Milus Banister, MD  [place], [place] [address]   Delrae Rend, M.D.

## 2010-06-01 NOTE — Discharge Summary (Signed)
NAME:  Kenneth [name], Kenneth [name]                ACCOUNT NO.:  [id number]   MEDICAL RECORD NO.:  [id number]          PATIENT TYPE:  INP   LOCATION:  [place]                         FACILITY:  [facility]   PHYSICIAN:  Corinna L. Conley Canal, MDDATE OF BIRTH:  March 17, 1975   DATE OF ADMISSION:  01/15/2008  DATE OF DISCHARGE:  01/17/2008                               DISCHARGE SUMMARY   DISCHARGE DIAGNOSES:  1. Drug-induced hepatitis most likely secondary to sulfasalazine, but      cannot rule out reaction to Avelox.  2. Diffuse rash secondary to sulfasalazine most likely.  3. Ulcerative colitis.  4. Anemia.  5. Steroid related hyperglycemia.   DISCHARGE MEDICATIONS:  1. Stop sulfasalazine.  2. No Tylenol or ibuprofen until liver recovers and cleared by      physician.  3. Continue Lantus 10 units subcutaneously q.a.m.  4. Prednisone 10 mg a day.   FOLLOW UP:  Follow up with Dr. Ardis Hughs' office on January 19, 2008 for  labs.  Follow up with Dr. Ardis Hughs on January 22, 2008.   CONDITION:  Stable.   ACTIVITY:  He may return to work tomorrow.   CONSULTATIONS:  Milus Banister, MD.   PROCEDURE:  None.   LABORATORY DATA:  CBC on admission significant for hemoglobin 9.5,  hematocrit 28.6, otherwise unremarkable.  INR on admission 1.3, PT 16.4,  PTT 40.  Basic metabolic panel on admission significant for a sodium of  134, otherwise unremarkable.  Total protein was 5.4, albumin 2.9, AST  745, ALT 888, alkaline phosphatase 325, total bilirubin 5.1, direct  bilirubin 4.2, indirect bilirubin 2.4.  Hepatitis B, C, A virus screens  negative.  Acetaminophen level on January 16, 2008 was negligible.  Urinalysis showed moderate bilirubin.   DIAGNOSTICS:  Chest x-ray showed no infiltrate.   HISTORY/HOSPITAL COURSE:  Mr. Mutschler is a pleasant 35 year old white  male with a history of ulcerative colitis followed by [facility].  He  had been on steroids for 3 months and required Lantus for this.  He had  been started on  Avelox for presumed pneumonia on January 11, 2008.  He  had a 10-day history of fever.  He had been on sulfasalazine prior to  admission as well.  He presented with a rash which was pruritic and  diffusely erythematous.  He also had elevated liver function tests.  The  patient was seen at the [facility] [place].  GI was consulted and  recommended outpatient followup.  Subsequently, apparently the urgent  care physician spoke with the on call [facility] who  subsequently admitted the patient.  His rash worsened the day after  admission.  I discussed the case with Dr. Ardis Hughs and we both felt that  this was most likely a reaction to sulfasalazine, but could not rule out  Avelox,  both were stopped.  He was given supportive care for his rash.  He did  have low-grade fevers and I suspect his fevers all along were secondary  to drug reaction.  His antibiotics were stopped.  His rash had improved  at the time of discharge and  he was also cleared by GI for discharge and  close outpatient followup.      Corinna L. Conley Canal, MD  Electronically Signed     CLS/MEDQ  D:  02/25/2008  T:  02/25/2008  Job:  [id number]

## 2010-10-16 LAB — GLUCOSE, CAPILLARY
Glucose-Capillary: 102 — ABNORMAL HIGH
Glucose-Capillary: 109 — ABNORMAL HIGH
Glucose-Capillary: 127 — ABNORMAL HIGH
Glucose-Capillary: 140 — ABNORMAL HIGH
Glucose-Capillary: 142 — ABNORMAL HIGH
Glucose-Capillary: 171 — ABNORMAL HIGH
Glucose-Capillary: 172 — ABNORMAL HIGH
Glucose-Capillary: 176 — ABNORMAL HIGH
Glucose-Capillary: 205 — ABNORMAL HIGH
Glucose-Capillary: 244 — ABNORMAL HIGH
Glucose-Capillary: 252 — ABNORMAL HIGH
Glucose-Capillary: 259 — ABNORMAL HIGH
Glucose-Capillary: 286 — ABNORMAL HIGH
Glucose-Capillary: 302 — ABNORMAL HIGH
Glucose-Capillary: 318 — ABNORMAL HIGH
Glucose-Capillary: 355 — ABNORMAL HIGH
Glucose-Capillary: 380 — ABNORMAL HIGH
Glucose-Capillary: 426 — ABNORMAL HIGH
Glucose-Capillary: 495 — ABNORMAL HIGH
Glucose-Capillary: 77
Glucose-Capillary: 94

## 2010-10-16 LAB — BASIC METABOLIC PANEL
BUN: 13
BUN: 22
CO2: 22
CO2: 24
Calcium: 8.2 — ABNORMAL LOW
Calcium: 8.8
Chloride: 100
Chloride: 101
Creatinine, Ser: 1.09
Creatinine, Ser: 1.27
GFR calc Af Amer: >60
GFR calc Af Amer: >60
GFR calc non Af Amer: >60
GFR calc non Af Amer: >60
Glucose, Bld: 438 — ABNORMAL HIGH
Glucose, Bld: 450 — ABNORMAL HIGH
Potassium: 3.4 — ABNORMAL LOW
Potassium: 4.1
Sodium: 133 — ABNORMAL LOW
Sodium: 134 — ABNORMAL LOW

## 2010-10-16 LAB — CBC
HCT: 37 — ABNORMAL LOW
Hemoglobin: 12.4 — ABNORMAL LOW
MCHC: 33.6
MCV: 83.4
Platelets: 249
RBC: 4.44
RDW: 16.2 — ABNORMAL HIGH
WBC: 5.9

## 2010-10-16 LAB — HEMOGLOBIN A1C
Hgb A1c MFr Bld: 10.5 — ABNORMAL HIGH
Mean Plasma Glucose: 255

## 2010-10-16 LAB — URINALYSIS, ROUTINE W REFLEX MICROSCOPIC
Bilirubin Urine: NEGATIVE
Glucose, UA: >1000 — AB
Hgb urine dipstick: NEGATIVE
Ketones, ur: >80 — AB
Leukocytes, UA: NEGATIVE
Nitrite: NEGATIVE
Protein, ur: NEGATIVE
Specific Gravity, Urine: 1.04 — ABNORMAL HIGH
Urobilinogen, UA: 0.2
pH: 5.5

## 2010-10-16 LAB — LIPID PANEL
Cholesterol: 193
HDL: 34 — ABNORMAL LOW
LDL Cholesterol: 114 — ABNORMAL HIGH
Total CHOL/HDL Ratio: 5.7
Triglycerides: 225 — ABNORMAL HIGH
VLDL: 45 — ABNORMAL HIGH

## 2010-10-16 LAB — TSH: TSH: 0.622

## 2010-10-16 LAB — PHOSPHORUS: Phosphorus: 3.7

## 2010-10-16 LAB — URINE MICROSCOPIC-ADD ON

## 2010-10-19 LAB — CBC
HCT: 42.1
Hemoglobin: 14.5
MCHC: 34.5
MCV: 89.8
Platelets: 272
RBC: 4.69
RDW: 13
WBC: 4.9

## 2010-10-19 LAB — BASIC METABOLIC PANEL
BUN: 13
CO2: 28
Calcium: 9.3
Chloride: 103
Creatinine, Ser: 0.95
GFR calc Af Amer: >60
GFR calc non Af Amer: >60
Glucose, Bld: 114 — ABNORMAL HIGH
Potassium: 4.6
Sodium: 138

## 2010-10-19 LAB — DIFFERENTIAL
Basophils Absolute: 0
Basophils Relative: 1
Eosinophils Absolute: 0.1 — ABNORMAL LOW
Eosinophils Relative: 3
Lymphocytes Relative: 44
Lymphs Abs: 2.2
Monocytes Absolute: 0.5
Monocytes Relative: 11
Neutro Abs: 2
Neutrophils Relative %: 42 — ABNORMAL LOW

## 2010-10-19 LAB — GLUCOSE, CAPILLARY
Glucose-Capillary: 102 mg/dL — ABNORMAL HIGH (ref 70–99)
Glucose-Capillary: 96 mg/dL (ref 70–99)

## 2010-11-15 HISTORY — PX: WISDOM TOOTH EXTRACTION: SHX21

## 2011-06-26 ENCOUNTER — Telehealth: Payer: Self-pay | Admitting: Gastroenterology

## 2011-06-26 ENCOUNTER — Other Ambulatory Visit (INDEPENDENT_AMBULATORY_CARE_PROVIDER_SITE_OTHER): Payer: 59

## 2011-06-26 ENCOUNTER — Telehealth: Payer: Self-pay

## 2011-06-26 DIAGNOSIS — K625 Hemorrhage of anus and rectum: Secondary | ICD-10-CM

## 2011-06-26 LAB — COMPREHENSIVE METABOLIC PANEL
ALT: 9 U/L (ref 0–53)
AST: 14 U/L (ref 0–37)
Albumin: 2.9 g/dL — ABNORMAL LOW (ref 3.5–5.2)
Alkaline Phosphatase: 44 U/L (ref 39–117)
BUN: 10 mg/dL (ref 6–23)
CO2: 27 mEq/L (ref 19–32)
Calcium: 8.2 mg/dL — ABNORMAL LOW (ref 8.4–10.5)
Chloride: 99 mEq/L (ref 96–112)
Creatinine, Ser: 0.8 mg/dL (ref 0.4–1.5)
GFR: 112.43 mL/min (ref >60.00)
Glucose, Bld: 89 mg/dL (ref 70–99)
Potassium: 3.7 mEq/L (ref 3.5–5.1)
Sodium: 135 mEq/L (ref 135–145)
Total Bilirubin: 0.2 mg/dL — ABNORMAL LOW (ref 0.3–1.2)
Total Protein: 6.5 g/dL (ref 6.0–8.3)

## 2011-06-26 LAB — CBC WITH DIFFERENTIAL/PLATELET
Basophils Absolute: 0.1 10*3/uL (ref 0.0–0.1)
Basophils Relative: 1 % (ref 0.0–3.0)
Eosinophils Absolute: 1.3 10*3/uL — ABNORMAL HIGH (ref 0.0–0.7)
Eosinophils Relative: 21.7 % — ABNORMAL HIGH (ref 0.0–5.0)
HCT: 25.2 % — ABNORMAL LOW (ref 39.0–52.0)
Hemoglobin: 8.3 g/dL — ABNORMAL LOW (ref 13.0–17.0)
Lymphocytes Relative: 33.1 % (ref 12.0–46.0)
Lymphs Abs: 2 10*3/uL (ref 0.7–4.0)
MCHC: 32.9 g/dL (ref 30.0–36.0)
MCV: 86.8 fl (ref 78.0–100.0)
Monocytes Absolute: 0.8 10*3/uL (ref 0.1–1.0)
Monocytes Relative: 13.2 % — ABNORMAL HIGH (ref 3.0–12.0)
Neutro Abs: 1.9 10*3/uL (ref 1.4–7.7)
Neutrophils Relative %: 31 % — ABNORMAL LOW (ref 43.0–77.0)
Platelets: 505 10*3/uL — ABNORMAL HIGH (ref 150.0–400.0)
RBC: 2.9 Mil/uL — ABNORMAL LOW (ref 4.22–5.81)
RDW: 13.6 % (ref 11.5–14.6)
WBC: 6.2 10*3/uL (ref 4.5–10.5)

## 2011-06-26 NOTE — Telephone Encounter (Signed)
Pt appt has been moved up to 07/02/11 he has been having rectal bleeding and would like to be seen sooner.  Dr Ardis Hughs his bleeding is with every stool and is BR and colors the water red..  Do you want to order a CBC before the appt?

## 2011-06-26 NOTE — Telephone Encounter (Signed)
Pt is aware to come in for labs prior to his appt

## 2011-06-26 NOTE — Telephone Encounter (Signed)
Yes, thanks.  Also cmet

## 2011-06-26 NOTE — Telephone Encounter (Signed)
Dr Ardis Hughs the pt's Hgb is 8.3 do you want me to schedule with Nevin Bloodgood, she doesn't have any openings tomorrow but she does have 2 for Friday?

## 2011-06-26 NOTE — Telephone Encounter (Signed)
I spoke with Dr Ardis Hughs and he advises the pt start an Iron supplement over the counter once daily.  He will be offered an appt with Tye Savoy on Friday and if he is not comfortable with that Dr Ardis Hughs will see him next week.  Left message on machine to call back

## 2011-06-27 NOTE — Telephone Encounter (Signed)
The pt was offered an appt with Nevin Bloodgood but he prefers to keep appt with Dr Ardis Hughs on Tues.  He feels ok just tired and the bleeding is not any worse.  He will call if his symptoms worsen.  He will begin iron today.

## 2011-06-28 ENCOUNTER — Ambulatory Visit: Payer: 59 | Admitting: Nurse Practitioner

## 2011-07-02 ENCOUNTER — Encounter: Payer: Self-pay | Admitting: Gastroenterology

## 2011-07-02 ENCOUNTER — Other Ambulatory Visit: Payer: Self-pay | Admitting: Nurse Practitioner

## 2011-07-02 ENCOUNTER — Other Ambulatory Visit (INDEPENDENT_AMBULATORY_CARE_PROVIDER_SITE_OTHER): Payer: 59

## 2011-07-02 ENCOUNTER — Emergency Department (HOSPITAL_COMMUNITY): Admission: EM | Admit: 2011-07-02 | Discharge: 2011-07-02 | Disposition: A | Discharge status: Hospice | Payer: 59

## 2011-07-02 ENCOUNTER — Encounter (HOSPITAL_COMMUNITY): Payer: Self-pay

## 2011-07-02 ENCOUNTER — Inpatient Hospital Stay (HOSPITAL_COMMUNITY)
Admission: AD | Admit: 2011-07-02 | Discharge: 2011-07-04 | DRG: 386 | Disposition: A | Discharge status: Home / Self Care | Payer: 59 | Source: Ambulatory Visit | Attending: Gastroenterology | Admitting: Gastroenterology

## 2011-07-02 ENCOUNTER — Ambulatory Visit (INDEPENDENT_AMBULATORY_CARE_PROVIDER_SITE_OTHER): Payer: 59 | Admitting: Gastroenterology

## 2011-07-02 VITALS — BP 110/60 | HR 128 | Ht 73.0 in | Wt 155.1 lb

## 2011-07-02 DIAGNOSIS — T380X5A Adverse effect of glucocorticoids and synthetic analogues, initial encounter: Secondary | ICD-10-CM | POA: Diagnosis present

## 2011-07-02 DIAGNOSIS — R7989 Other specified abnormal findings of blood chemistry: Secondary | ICD-10-CM

## 2011-07-02 DIAGNOSIS — K5130 Ulcerative (chronic) rectosigmoiditis without complications: Secondary | ICD-10-CM

## 2011-07-02 DIAGNOSIS — E139 Other specified diabetes mellitus without complications: Secondary | ICD-10-CM | POA: Diagnosis present

## 2011-07-02 DIAGNOSIS — K5289 Other specified noninfective gastroenteritis and colitis: Secondary | ICD-10-CM

## 2011-07-02 DIAGNOSIS — K529 Noninfective gastroenteritis and colitis, unspecified: Secondary | ICD-10-CM

## 2011-07-02 DIAGNOSIS — D62 Acute posthemorrhagic anemia: Secondary | ICD-10-CM | POA: Diagnosis present

## 2011-07-02 DIAGNOSIS — K5190 Ulcerative colitis, unspecified, without complications: Secondary | ICD-10-CM

## 2011-07-02 DIAGNOSIS — E119 Type 2 diabetes mellitus without complications: Secondary | ICD-10-CM

## 2011-07-02 DIAGNOSIS — D500 Iron deficiency anemia secondary to blood loss (chronic): Secondary | ICD-10-CM | POA: Diagnosis present

## 2011-07-02 DIAGNOSIS — D509 Iron deficiency anemia, unspecified: Secondary | ICD-10-CM | POA: Diagnosis present

## 2011-07-02 DIAGNOSIS — Z87891 Personal history of nicotine dependence: Secondary | ICD-10-CM

## 2011-07-02 DIAGNOSIS — Z794 Long term (current) use of insulin: Secondary | ICD-10-CM

## 2011-07-02 DIAGNOSIS — K5150 Left sided colitis without complications: Principal | ICD-10-CM | POA: Diagnosis present

## 2011-07-02 HISTORY — DX: Hyperglycemia, unspecified: R73.9

## 2011-07-02 HISTORY — DX: Hyperglycemia, unspecified: T38.0X5A

## 2011-07-02 LAB — COMPREHENSIVE METABOLIC PANEL
ALT: 11 U/L (ref 0–53)
AST: 11 U/L (ref 0–37)
Albumin: 2.8 g/dL — ABNORMAL LOW (ref 3.5–5.2)
Alkaline Phosphatase: 46 U/L (ref 39–117)
BUN: 8 mg/dL (ref 6–23)
CO2: 26 mEq/L (ref 19–32)
Calcium: 8 mg/dL — ABNORMAL LOW (ref 8.4–10.5)
Chloride: 104 mEq/L (ref 96–112)
Creatinine, Ser: 0.7 mg/dL (ref 0.4–1.5)
GFR: 128.34 mL/min (ref >60.00)
Glucose, Bld: 212 mg/dL — ABNORMAL HIGH (ref 70–99)
Potassium: 3.7 mEq/L (ref 3.5–5.1)
Sodium: 137 mEq/L (ref 135–145)
Total Bilirubin: 0.2 mg/dL — ABNORMAL LOW (ref 0.3–1.2)
Total Protein: 6.6 g/dL (ref 6.0–8.3)

## 2011-07-02 LAB — CBC WITH DIFFERENTIAL/PLATELET
Basophils Absolute: 0.1 10*3/uL (ref 0.0–0.1)
Basophils Relative: 1 % (ref 0.0–3.0)
Eosinophils Absolute: 1 10*3/uL — ABNORMAL HIGH (ref 0.0–0.7)
Eosinophils Relative: 15.1 % — ABNORMAL HIGH (ref 0.0–5.0)
HCT: 22.8 % — CL (ref 39.0–52.0)
Hemoglobin: 7.3 g/dL — CL (ref 13.0–17.0)
Lymphocytes Relative: 21.4 % (ref 12.0–46.0)
Lymphs Abs: 1.4 10*3/uL (ref 0.7–4.0)
MCHC: 31.8 g/dL (ref 30.0–36.0)
MCV: 85.2 fl (ref 78.0–100.0)
Monocytes Absolute: 1.1 10*3/uL — ABNORMAL HIGH (ref 0.1–1.0)
Monocytes Relative: 17.3 % — ABNORMAL HIGH (ref 3.0–12.0)
Neutro Abs: 2.9 10*3/uL (ref 1.4–7.7)
Neutrophils Relative %: 45.2 % (ref 43.0–77.0)
Platelets: 451 10*3/uL — ABNORMAL HIGH (ref 150.0–400.0)
RBC: 2.68 Mil/uL — ABNORMAL LOW (ref 4.22–5.81)
RDW: 14.9 % — ABNORMAL HIGH (ref 11.5–14.6)
WBC: 6.4 10*3/uL (ref 4.5–10.5)

## 2011-07-02 LAB — GLUCOSE, CAPILLARY
Glucose-Capillary: 122 mg/dL — ABNORMAL HIGH (ref 70–99)
Glucose-Capillary: 129 mg/dL — ABNORMAL HIGH (ref 70–99)

## 2011-07-02 LAB — ABO/RH: ABO/RH(D): A POS

## 2011-07-02 LAB — PREPARE RBC (CROSSMATCH)

## 2011-07-02 MED ORDER — ONDANSETRON HCL 4 MG PO TABS: 4.0000 mg | ORAL_TABLET | Freq: Four times a day (QID) | ORAL | Status: DC | PRN

## 2011-07-02 MED ORDER — ACETAMINOPHEN 650 MG RE SUPP: 650.0000 mg | Freq: Four times a day (QID) | RECTAL | Status: DC | PRN

## 2011-07-02 MED ORDER — METHYLPREDNISOLONE SODIUM SUCC 40 MG IJ SOLR
20.0000 mg | Freq: Two times a day (BID) | INTRAMUSCULAR | Status: DC
  Administered 2011-07-02: 18:00:00 via INTRAVENOUS
  Administered 2011-07-03 – 2011-07-04 (×3): 20 mg via INTRAVENOUS
  Filled 2011-07-02 (×2): qty 0.5
  Filled 2011-07-02 (×2): qty 1
  Filled 2011-07-02 (×2): qty 0.5

## 2011-07-02 MED ORDER — HYDROCODONE-ACETAMINOPHEN 5-325 MG PO TABS
1.0000 | ORAL_TABLET | ORAL | Status: DC | PRN
  Administered 2011-07-02 – 2011-07-03 (×2): 1 via ORAL
  Filled 2011-07-02: qty 1
  Filled 2011-07-02 (×2): qty 2

## 2011-07-02 MED ORDER — ONDANSETRON HCL 4 MG/2ML IJ SOLN: 4.0000 mg | Freq: Four times a day (QID) | INTRAMUSCULAR | Status: DC | PRN

## 2011-07-02 MED ORDER — ACETAMINOPHEN 325 MG PO TABS: 650.0000 mg | ORAL_TABLET | Freq: Four times a day (QID) | ORAL | Status: DC | PRN

## 2011-07-02 MED ORDER — INSULIN ASPART 100 UNIT/ML ~~LOC~~ SOLN: 0.0000 [IU] | Freq: Three times a day (TID) | SUBCUTANEOUS | Status: DC

## 2011-07-02 MED ORDER — INSULIN GLARGINE 100 UNIT/ML ~~LOC~~ SOLN
15.0000 [IU] | Freq: Every day | SUBCUTANEOUS | Status: DC
  Administered 2011-07-03 – 2011-07-04 (×2): 15 [IU] via SUBCUTANEOUS

## 2011-07-02 MED ORDER — INSULIN ASPART 100 UNIT/ML ~~LOC~~ SOLN: 0.0000 [IU] | Freq: Every day | SUBCUTANEOUS | Status: DC

## 2011-07-02 MED ORDER — POTASSIUM CHLORIDE IN NACL 20-0.9 MEQ/L-% IV SOLN
INTRAVENOUS | Status: DC
  Administered 2011-07-02 – 2011-07-04 (×3): via INTRAVENOUS
  Filled 2011-07-02 (×5): qty 1000

## 2011-07-02 MED ORDER — LORAZEPAM 0.5 MG PO TABS
0.5000 mg | ORAL_TABLET | Freq: Three times a day (TID) | ORAL | Status: DC | PRN
  Administered 2011-07-03 (×2): 0.5 mg via ORAL
  Filled 2011-07-02 (×2): qty 1

## 2011-07-02 NOTE — Consult Note (Signed)
Reason for Consult: Diabetes management Referring Physician: Dr Ardis Hughs   HPI:  The patient is a 36 year old male with past medical history significant for steroid-induced hyperglycemia and ulcerative colitis admitted to the GI service with rectal bleeding consistent with flare of his colitis. Internal medicine/hospitalist consulted for management of his steroid-induced hyperglycemia. Patient denies polyuria, polydipsia. He also denies dysuria cough chest pain fevers. Past Medical History  Diagnosis Date  . Colitis   . Steroid-induced hyperglycemia      History reviewed. No pertinent family history.  Social History:  reports that he quit smoking about 7 years ago. His smoking use included Cigarettes. He has never used smokeless tobacco. He reports that he drinks alcohol. He reports that he does not use illicit drugs.  Allergies:  Allergies  Allergen Reactions  . Moxifloxacin Other (See Comments)    Liver failure  . Sulfonamide Derivatives Other (See Comments)    Liver failure    Medications: I have reviewed the patient's current medications.  Results for orders placed during the hospital encounter of 07/02/11 (from the past 48 hour(s))  PREPARE RBC (CROSSMATCH)     Status: Normal   Collection Time   07/02/11  3:42 PM      Component Value Range Comment   Order Confirmation ORDER PROCESSED BY BLOOD BANK     TYPE AND SCREEN     Status: Normal (Preliminary result)   Collection Time   07/02/11  4:25 PM      Component Value Range Comment   ABO/RH(D) A POS      Antibody Screen NEG      Sample Expiration 07/05/2011      Unit Number [id number]      Blood Component Type RED CELLS,LR      Unit division 00      Status of Unit ALLOCATED      Transfusion Status OK TO TRANSFUSE      Crossmatch Result Compatible      Unit Number [id number]      Blood Component Type RED CELLS,LR      Unit division 00      Status of Unit ALLOCATED      Transfusion Status OK TO TRANSFUSE      Crossmatch  Result Compatible     ABO/RH     Status: Normal   Collection Time   07/02/11  5:45 PM      Component Value Range Comment   ABO/RH(D) A POS       No results found.  Review of systems as per history of present illness.  Blood pressure 117/73, pulse 102, temperature 98.6 F (37 C), temperature source Oral, resp. rate 16, height 6' 1"  (1.854 m), weight 70.308 kg (155 lb), SpO2 100.00%.  General Appearance:    Alert, cooperative, no distress, appears stated age  Lungs:     Clear to auscultation bilaterally, respirations unlabored   Heart:    Regular rate and rhythm, S1 and S2 normal, no murmur, rub   or gallop  Abdomen:     Soft, non-tender, bowel sounds active all four quadrants,    no masses, no organomegaly  Extremities:   Extremities normal, atraumatic, no cyanosis or edema  Neurologic:   CNII-XII intact, normal strength, sensation and reflexes    throughout     Assessment/Plan: Steroid-induced hyperglycemia -His blood sugars so far today have ranged from 77-122 per nursing staff, and patient states he took his Lantus 15 units this a.m. -Agree with Monitoring Accu-Cheks a.c.  and at bedtime, with sliding scale insulin ordered -Will restart Lantus and monitor and adjust dose as appropriate for optimal blood glucose control ( on Solu-Medrol 13m twice a day started today)  Thanks for allowing [facility] participate in the care of this patient, will follow with you.  Shron Ozer C 07/02/2011, 7:16 PM

## 2011-07-02 NOTE — Patient Instructions (Addendum)
Will admit to [facility] for IV steroids, blood sugar management, possible blood transfusion. You will have labs checked today in the basement lab.  Please head down after you check out with the front desk  (cbc, cmet).

## 2011-07-02 NOTE — ED Notes (Signed)
Spoke with Wallace Going, NP. Pt is supposed to be direct admit, she is coming to dept to write orders. Webb Silversmith, NP in to screen pt. Pt placed in consultation room.

## 2011-07-02 NOTE — H&P (Signed)
Duplicate H&P

## 2011-07-02 NOTE — ED Notes (Signed)
Nevin Bloodgood, NP at bedside.

## 2011-07-02 NOTE — H&P (Signed)
[facility] History and Physical Admission Note  Patient admitted from office by Dr. Ardis Hughs. Note below is the pasted office H&P by Dr. Dia Crawford, MD  07/02/2011 10:04 AM  Signed Review of pertinent gastrointestinal problems: 1. Left sided UC:  Colonoscopy Dr. Penelope Coop 2006 up to 30cm. Mesalamine products did not seem to help very well. Significant flare summer 2009 steroids started. Had extreme hyperglycemia on steroids requiring insulin. Repeat colonoscopy December, 2009 showed inflammation moderate To 70 cm. He was started on sulfasalazine. Had drug rash, likely pneumonitis, LFT abnormalities ( transaminases up to almost 1000, bili up to 6). Holding his medicines resulted in complete resolution of abnormal labs checked serially. TPMT testing January, 2010 showed normal enzyme activity. February, 2010 tapered off all medicines and feels very well. May, 2010: flaring of symptoms for the past month, He went to see a [place] herbalist, did not start the oral mesalamine that I recommended, restarted prednisone. September, 2010 self-medicating with prednisone however tapering off. January, 2011, : doing well off traditional IBD medicines, taking [place] Herb.    HPI: This is a  very pleasant 36 year old man whom I last saw about 2-1/2 years ago. His wife is one of the oncology nurses. 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 He has been reluctant to try maintenance medicines for his ulcerative colitis and actually has been doing just fine for the past 2 years on no therapy at all.  Started bleeding 10 weeks ago, badly in past 6 weeks.  Lots of blood ("tons of blood").  Having back pains lately .  His HR has been up, noticed by his wife, Therapist, sports.  Having mild abdominal cramps. He probably has 10-15 bloody bowel movements a day lately getting up in the middle the night. He is not sleeping at all. His insulin requirements resumed about 3 months ago and are climbing.  Labs last week: cmet  normal except low alb, hb 8.5, plt 505    Past Medical History   Diagnosis  Date   .  Colitis    Steroid induced diabetes       History reviewed. No pertinent past surgical history.    Current Outpatient Prescriptions   Medication  Sig  Dispense  Refill   .  insulin aspart (NOVOLOG) 100 UNIT/ML injection  Inject into the skin 3 (three) times daily before meals.         .  insulin glargine (LANTUS) 100 UNIT/ML injection  Inject into the skin every morning.         .  Multiple Vitamin (MULTIVITAMIN) tablet  Take 1 tablet by mouth daily.            Allergies as of 07/02/2011 - Review Complete 07/02/2011   Allergen  Reaction  Noted   .  Moxifloxacin       .  Sulfonamide derivatives        History reviewed. No pertinent family history.    History    Social History   .  Marital Status:  Married       Spouse Name:  N/A       Number of Children:  0   .  Years of Education:  N/A       Occupational History   .  artist         Social History Main Topics   .  Smoking status:  Former Research scientist (life sciences)   .  Smokeless tobacco:  Never Used   .  Alcohol Use:  Yes         social   .  Drug Use:  No   .  Sexually Active:  Not on file       Other Topics  Concern   .  Not on file       Social History Narrative   .  No narrative on file     Physical Exam: BP 110/60  Pulse 128  Ht 6' 1"  (1.854 m)  Wt 155 lb 2 oz (70.364 kg)  BMI 20.47 kg/m2 Constitutional: Quite thin again, pale Psychiatric: alert and oriented x3 Abdomen: soft, mildly tender throughout, nondistended, no obvious ascites, no peritoneal signs, normal bowel sounds   Assessment and plan: 36 y.o. male with previously documented left-sided colitis now in a serious flare   His heart rate is 130. He is having 10-15 very bloody bowel movements a day. His hemoglobin last week was 8.5 and I suspect today would be in the mid to low sevens. I'm going to admit him to the hospital for IV steroids, he will require internal medicine  consultation to follow his blood sugars since he has had DKA on prednisone in the past. I suspect he is going to need at least 1-2 bags of red blood. He is going to get CBC, complete metabolic profile today. I talked to Nevin Bloodgood who is covering inpatient hospital. I will also communicate with Dr. Carlean Purl. My hope is to cool him down with steroids, watching his blood sugars closely. After a month or so I would like to see them back and then hopefully start him on immunomodulators. I don't think mesalamine is going to help much certainly now that and there may be a role for to the future.      Electronic signature on 07/02/2011          Orders Placed This Encounter     Future Orders  Comprehensive metabolic panel [SPQ33 Custom]   Expires: 07/01/12   CBC with Differential [AQT6226 Custom]   Expires: 07/01/12         Patient Instructions     Will admit to [facility] for IV steroids, blood sugar management, possible blood transfusion. You will have labs checked today in the basement lab.  Please head down after you check out with the front desk  (cbc, cmet).

## 2011-07-02 NOTE — Progress Notes (Signed)
Review of pertinent gastrointestinal problems: 1. Left sided UC:  Colonoscopy Dr. Penelope Coop 2006 up to 30cm. Mesalamine products did not seem to help very well. Significant flare summer 2009 steroids started. Had extreme hyperglycemia on steroids requiring insulin. Repeat colonoscopy December, 2009 showed inflammation moderate To 70 cm. He was started on sulfasalazine. Had drug rash, likely pneumonitis, LFT abnormalities ( transaminases up to almost 1000, bili up to 6). Holding his medicines resulted in complete resolution of abnormal labs checked serially. TPMT testing January, 2010 showed normal enzyme activity. February, 2010 tapered off all medicines and feels very well. May, 2010: flaring of symptoms for the past month, He went to see a [place] herbalist, did not start the oral mesalamine that I recommended, restarted prednisone. September, 2010 self-medicating with prednisone however tapering off. January, 2011, : doing well off traditional IBD medicines, taking [place] Herb.    HPI: This is a  very pleasant 36 year old man whom I last saw about 2-1/2 years ago. His wife is one of the oncology nurses. 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 He has been reluctant to try maintenance medicines for his ulcerative colitis and actually has been doing just fine for the past 2 years on no therapy at all.   Started bleeding 10 weeks ago, badly in past 6 weeks.  Lots of blood ("tons of blood").  Having back pains lately .  His HR has been up, noticed by his wife, Therapist, sports.  Having mild abdominal cramps. He probably has 10-15 bloody bowel movements a day lately getting up in the middle the night. He is not sleeping at all. His insulin requirements resumed about 3 months ago and are climbing.   Labs last week: cmet normal except low alb, hb 8.5, plt 505   Past Medical History  Diagnosis Date  . Colitis     History reviewed. No pertinent past surgical history.  Current Outpatient Prescriptions  Medication  Sig Dispense Refill  . insulin aspart (NOVOLOG) 100 UNIT/ML injection Inject into the skin 3 (three) times daily before meals.      . insulin glargine (LANTUS) 100 UNIT/ML injection Inject into the skin every morning.      . Multiple Vitamin (MULTIVITAMIN) tablet Take 1 tablet by mouth daily.        Allergies as of 07/02/2011 - Review Complete 07/02/2011  Allergen Reaction Noted  . Moxifloxacin    . Sulfonamide derivatives      History reviewed. No pertinent family history.  History   Social History  . Marital Status: Married    Spouse Name: N/A    Number of Children: 0  . 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 Smoking status: Former Research scientist (life sciences)  . Smokeless tobacco: Never Used  . Alcohol Use: Yes     social  . Drug Use: No  . Sexually Active: Not on file   Other Topics Concern  . Not on file   Social History Narrative  . No narrative on file      Physical Exam: BP 110/60  Pulse 128  Ht 6' 1"  (1.854 m)  Wt 155 lb 2 oz (70.364 kg)  BMI 20.47 kg/m2 Constitutional: Quite thin again, pale Psychiatric: alert and oriented x3 Abdomen: soft, mildly tender throughout, nondistended, no obvious ascites, no peritoneal signs, normal bowel sounds     Assessment and plan: 36 y.o. male with previously documented left-sided colitis now in a serious flare  His heart rate is 130. He is having 10-15 very bloody bowel movements a day. His hemoglobin last week was 8.5 and I suspect today would be in the mid to low sevens. I'm going to admit him to the hospital for IV steroids, he will require internal medicine consultation to follow his blood sugars since he has had DKA on prednisone in the past. I suspect he is going to need at least 1-2 bags of red blood. He is going to get CBC, complete metabolic profile today. I talked to Nevin Bloodgood who is covering inpatient hospital. I will also communicate with Dr. Carlean Purl. My hope is to cool him down  with steroids, watching his blood sugars closely. After a month or so I would like to see them back and then hopefully start him on immunomodulators. I don't think mesalamine is going to help much certainly now that and there may be a role for to the future.

## 2011-07-03 DIAGNOSIS — D500 Iron deficiency anemia secondary to blood loss (chronic): Secondary | ICD-10-CM | POA: Diagnosis present

## 2011-07-03 DIAGNOSIS — K5190 Ulcerative colitis, unspecified, without complications: Secondary | ICD-10-CM | POA: Diagnosis present

## 2011-07-03 DIAGNOSIS — K5100 Ulcerative (chronic) pancolitis without complications: Secondary | ICD-10-CM

## 2011-07-03 DIAGNOSIS — R7989 Other specified abnormal findings of blood chemistry: Secondary | ICD-10-CM

## 2011-07-03 DIAGNOSIS — K5130 Ulcerative (chronic) rectosigmoiditis without complications: Secondary | ICD-10-CM

## 2011-07-03 LAB — GLUCOSE, CAPILLARY
Glucose-Capillary: 165 mg/dL — ABNORMAL HIGH (ref 70–99)
Glucose-Capillary: 217 mg/dL — ABNORMAL HIGH (ref 70–99)
Glucose-Capillary: 387 mg/dL — ABNORMAL HIGH (ref 70–99)
Glucose-Capillary: 342 mg/dL — ABNORMAL HIGH (ref 70–99)

## 2011-07-03 LAB — BASIC METABOLIC PANEL
BUN: 6 mg/dL (ref 6–23)
CO2: 26 mEq/L (ref 19–32)
Calcium: 8.5 mg/dL (ref 8.4–10.5)
Chloride: 102 mEq/L (ref 96–112)
Creatinine, Ser: 0.7 mg/dL (ref 0.50–1.35)
GFR calc Af Amer: >90 mL/min (ref >90)
GFR calc non Af Amer: >90 mL/min (ref >90)
Glucose, Bld: 195 mg/dL — ABNORMAL HIGH (ref 70–99)
Potassium: 4.5 mEq/L (ref 3.5–5.1)
Sodium: 136 mEq/L (ref 135–145)

## 2011-07-03 LAB — CBC
HCT: 28.2 % — ABNORMAL LOW (ref 39.0–52.0)
Hemoglobin: 9 g/dL — ABNORMAL LOW (ref 13.0–17.0)
MCH: 26.6 pg (ref 26.0–34.0)
MCHC: 31.9 g/dL (ref 30.0–36.0)
MCV: 83.4 fL (ref 78.0–100.0)
Platelets: 416 10*3/uL — ABNORMAL HIGH (ref 150–400)
RBC: 3.38 MIL/uL — ABNORMAL LOW (ref 4.22–5.81)
RDW: 14.2 % (ref 11.5–15.5)
WBC: 4.6 10*3/uL (ref 4.0–10.5)

## 2011-07-03 LAB — HEMOGLOBIN A1C
Hgb A1c MFr Bld: 6.5 % — ABNORMAL HIGH (ref <5.7)
Mean Plasma Glucose: 140 mg/dL — ABNORMAL HIGH (ref <117)

## 2011-07-03 MED ORDER — INSULIN ASPART 100 UNIT/ML ~~LOC~~ SOLN
0.0000 [IU] | Freq: Every day | SUBCUTANEOUS | Status: DC
  Administered 2011-07-03: 4 [IU] via SUBCUTANEOUS

## 2011-07-03 MED ORDER — INSULIN ASPART 100 UNIT/ML ~~LOC~~ SOLN
0.0000 [IU] | Freq: Three times a day (TID) | SUBCUTANEOUS | Status: DC
  Administered 2011-07-03: 3 [IU] via SUBCUTANEOUS

## 2011-07-03 MED ORDER — INSULIN ASPART 100 UNIT/ML ~~LOC~~ SOLN
4.0000 [IU] | Freq: Three times a day (TID) | SUBCUTANEOUS | Status: DC
  Administered 2011-07-03 – 2011-07-04 (×2): 4 [IU] via SUBCUTANEOUS
  Administered 2011-07-04: 6 [IU] via SUBCUTANEOUS

## 2011-07-03 MED ORDER — INSULIN ASPART 100 UNIT/ML ~~LOC~~ SOLN
0.0000 [IU] | Freq: Three times a day (TID) | SUBCUTANEOUS | Status: DC
  Administered 2011-07-03: 9 [IU] via SUBCUTANEOUS
  Administered 2011-07-04: 20 [IU] via SUBCUTANEOUS
  Administered 2011-07-04: 7 [IU] via SUBCUTANEOUS

## 2011-07-03 NOTE — Progress Notes (Signed)
PROGRESS NOTE  Kenneth [name] HKV:[id number] DOB: [birth year] DOA: 07/02/2011 PCP: Scarlette Calico, MD  Brief narrative: Kenneth [name] is a 36 year old man with a past medical history of ulcerative colitis and steroid induced hyperglycemia who was admitted to the hospital on 07/02/2011 by Dr. Ardis Hughs of gastroenterology with bloody stools, abdominal cramping, and uncontrolled blood glucoses. Internal medicine was consulted for blood glucose management.  Interim History: Seen by the diabetes coordinator with recommendations noted.   Assessment/Plan: Active Problems: Steroid induced hyperglycemia / Impaired fasting glucose  Ports that he has a history of steroid-induced hyperglycemia but is not a diabetic.  CBG 122-217.  Continue insulin sensitive sliding scale but add 4 units of NovoLog before each meal per diabetes coordinator recommendations.  He has received 2 units of packed red blood cells, hemoglobin A1c on a post transfusion specimen is not likely to be entirely accurate so would defer this unless we can add it on to his pre-blood transfusion specimen.  Iron deficiency anemia secondary to blood loss (chronic)  Management per primary team.  Status post 2 units of packed red blood cells.  Ulcerative colitis  On steroids per primary team.    Subjective  No complaints of excessive thirst or urination.  Appetite good.  Diarrhea improving and less rectal bleeding.   Objective   Objective: Filed Vitals:   07/03/11 0330 07/03/11 0400 07/03/11 0425 07/03/11 1418  BP: 105/65 105/65 117/69 132/74  Pulse: 94 85 83 89  Temp: 97.8 F (36.6 C) 98.1 F (36.7 C) 98.2 F (36.8 C) 98.7 F (37.1 C)  TempSrc: Oral Oral Oral Oral  Resp: 18 18 18 18   Height:      Weight:      SpO2:   92% 100%    Intake/Output Summary (Last 24 hours) at 07/03/11 1503 Last data filed at 07/03/11 1200  Gross per 24 hour  Intake 3571.5 ml  Output      0 ml  Net 3571.5 ml    Exam: Gen:   NAD Cardiovascular:  RRR, No M/R/G Respiratory: Lungs CTAB Gastrointestinal: Abdomen soft, NT/ND with normal active bowel sounds. Extremities: No C/E/C  Data Reviewed: Basic Metabolic Panel:  Lab 56/43/32 0705 07/02/11 1013  NA 136 137  K 4.5 3.7  CL 102 104  CO2 26 26  GLUCOSE 195* 212*  BUN 6 8  CREATININE 0.70 0.7  CALCIUM 8.5 8.0*  MG -- --  PHOS -- --   GFR Estimated Creatinine Clearance: 129.4 ml/min (by C-G formula based on Cr of 0.7). Liver Function Tests:  Lab 07/02/11 1013  AST 11  ALT 11  ALKPHOS 46  BILITOT 0.2*  PROT 6.6  ALBUMIN 2.8*    CBC:  Lab 07/03/11 0730 07/02/11 1013  WBC 4.6 6.4  NEUTROABS -- 2.9  HGB 9.0* 7.3 Repeated and verified X2.*  HCT 28.2* 22.8 Repeated and verified X2.*  MCV 83.4 85.2  PLT 416* 451.0*   CBG:  Lab 07/03/11 1118 07/03/11 0744 07/02/11 2119 07/02/11 1758  GLUCAP 217* 165* 129* 122*    Procedures and Diagnostic Studies: No results found.  Scheduled Meds:    . insulin aspart  0-5 Units Subcutaneous QHS  . insulin aspart  0-9 Units Subcutaneous TID WC  . insulin glargine  15 Units Subcutaneous Daily  . methylPREDNISolone (SOLU-MEDROL) injection  20 mg Intravenous Q12H  . DISCONTD: insulin aspart  0-9 Units Subcutaneous TID WC   Continuous Infusions:    . 0.9 % NaCl with KCl 20 mEq / L  75 mL/hr at 07/03/11 1415      LOS: 1 day   Jacquelynn Cree, MD Pager [phone]  07/03/2011, 3:03 PM

## 2011-07-03 NOTE — Progress Notes (Signed)
[facility] Progress Note  SUBJECTIVE: feels much better today. Got 2 units of blood. Diarrhea less frequent and less rectal bleeding. Hungry  OBJECTIVE:  Vital signs in last 24 hours: Temp:  [97.8 F (36.6 C)-98.6 F (37 C)] 98.2 F (36.8 C) (06/19 0425) Pulse Rate:  [83-106] 83  (06/19 0425) Resp:  [16-20] 18  (06/19 0425) BP: (105-137)/(64-79) 117/69 mmHg (06/19 0425) SpO2:  [92 %-100 %] 92 % (06/19 0425) Weight:  [155 lb (70.308 kg)] 155 lb (70.308 kg) (06/18 1656)   General:    Pleasant white male in NAD Heart:  Regular rate and rhythm; no murmurs Lungs: Respirations even and unlabored, lungs CTA bilaterally Abdomen:  Soft, nontender and nondistended. Normal bowel sounds. Extremities:  Without edema. Neurologic:  Alert and oriented,  grossly normal neurologically. Psych:  Cooperative. Normal mood and affect.   Lab Results:  Basename 07/02/11 1013  WBC 6.4  HGB 7.3 Repeated and verified X2.*  HCT 22.8 Repeated and verified X2.*  PLT 451.0*   BMET  Basename 07/03/11 0705 07/02/11 1013  NA 136 137  K 4.5 3.7  CL 102 104  CO2 26 26  GLUCOSE 195* 212*  BUN 6 8  CREATININE 0.70 0.7  CALCIUM 8.5 8.0*   LFT  Basename 07/02/11 1013  PROT 6.6  ALBUMIN 2.8*  AST 11  ALT 11  ALKPHOS 46  BILITOT 0.2*  BILIDIR --  IBILI --     ASSESSMENT / PLAN:  1. Left sided ulcerative flare, improving on IV steroids. Continue IV steroids for couple of days and if improved can go home on Prednisone taper. Dr. Ardis Hughs to see outpatient to discuss maintenance medications. Tolerating clears, hungry. Will give him low residue solids.  2.  Anemia secondary to #1 blood loss-  He got 2 units of PRBC, todays CBC pending.  3.  Steroid induced diabetes, appreciated hospitalist's help with management. He will need regimen to go home on while taking steroids.     LOS: 1 day   Tye Savoy  07/03/2011, 10:35 AM  [facility] Attending  I have also seen and assessed the  patient and agree with the above note.  He is better with blood and IV steroids. Continue steroids and monitor/Tx DM.  Gatha Mayer, MD, [facility] [phone] (pager) 07/03/2011 11:27 AM

## 2011-07-03 NOTE — Progress Notes (Signed)
Inpatient Diabetes Program Recommendations  AACE/ADA: New Consensus Statement on Inpatient Glycemic Control (2009)  Target Ranges:  Prepandial:   less than 140 mg/dL      Peak postprandial:   less than 180 mg/dL (1-2 hours)      Critically ill patients:  140 - 180 mg/dL   Reason for Visit: Hyperglycemia on steroid therapy  Inpatient Diabetes Program Recommendations Insulin - Meal Coverage: Please add 4 units Novolog tidwc (gets when eats at least 50 % of meal) HgbA1C: Please check. Last documented was 11/25/2007. Diet: Please add carbohydrate modified to diet orders  Note: Thank you, Rosita Kea, RN, CNS, Diabetes Coordinator [phone])

## 2011-07-04 DIAGNOSIS — K5100 Ulcerative (chronic) pancolitis without complications: Secondary | ICD-10-CM

## 2011-07-04 DIAGNOSIS — E119 Type 2 diabetes mellitus without complications: Secondary | ICD-10-CM

## 2011-07-04 DIAGNOSIS — R7989 Other specified abnormal findings of blood chemistry: Secondary | ICD-10-CM

## 2011-07-04 LAB — IRON AND TIBC
Iron: 10 ug/dL — ABNORMAL LOW (ref 42–135)
Saturation Ratios: 3 % — ABNORMAL LOW (ref 20–55)
TIBC: 328 ug/dL (ref 215–435)
UIBC: 318 ug/dL (ref 125–400)

## 2011-07-04 LAB — FERRITIN: Ferritin: 23 ng/mL (ref 22–322)

## 2011-07-04 LAB — TYPE AND SCREEN
ABO/RH(D): A POS
Antibody Screen: NEGATIVE
Unit division: 0
Unit division: 0

## 2011-07-04 LAB — GLUCOSE, CAPILLARY
Glucose-Capillary: 302 mg/dL — ABNORMAL HIGH (ref 70–99)
Glucose-Capillary: 420 mg/dL — ABNORMAL HIGH (ref 70–99)

## 2011-07-04 MED ORDER — HYDROCODONE-ACETAMINOPHEN 5-325 MG PO TABS: 1.0000 | ORAL_TABLET | ORAL | Status: AC | PRN

## 2011-07-04 MED ORDER — INSULIN GLARGINE 100 UNIT/ML ~~LOC~~ SOLN: 25.0000 [IU] | Freq: Every morning | SUBCUTANEOUS | Status: DC

## 2011-07-04 MED ORDER — INSULIN ASPART 100 UNIT/ML ~~LOC~~ SOLN: 0.0000 [IU] | Freq: Every day | SUBCUTANEOUS | Status: DC

## 2011-07-04 MED ORDER — PREDNISONE (PAK) 10 MG PO TABS: 20.0000 mg | ORAL_TABLET | Freq: Two times a day (BID) | ORAL | Status: AC

## 2011-07-04 MED ORDER — INSULIN ASPART 100 UNIT/ML ~~LOC~~ SOLN: 3.0000 [IU] | Freq: Three times a day (TID) | SUBCUTANEOUS | Status: DC

## 2011-07-04 MED ORDER — INSULIN ASPART 100 UNIT/ML ~~LOC~~ SOLN: SUBCUTANEOUS | Status: DC

## 2011-07-04 NOTE — Progress Notes (Signed)
PROGRESS NOTE  Kenneth [name] [id number]:[id number] DOB: 11-09-1975 DOA: 07/02/2011 PCP: Scarlette Calico, MD  Brief narrative: Kenneth [name] is a 36 year old man with a past medical history of ulcerative colitis and steroid induced hyperglycemia who was admitted to the hospital on 07/02/2011 by Dr. Ardis Hughs of gastroenterology with bloody stools, abdominal cramping, and uncontrolled blood glucoses. Internal medicine was consulted for blood glucose management.  Interim History: Stable overnight.   Assessment/Plan: Active Problems: Steroid induced hyperglycemia / Impaired fasting glucose  Ports that he has a history of steroid-induced hyperglycemia but is not a diabetic.  CBG 165-387.  Hemoglobin A1c 6.5% which corresponds to a diagnosis of DM.  Discharge home on insulin resistant SSI, 6 units QAC meal coverage, and 25 units of Lantus Q HS.  Gave him instructions on how to titrate up/down based on blood glucoses.  He has received 2 units of packed red blood cells, hemoglobin A1c on a post transfusion specimen is not likely to be entirely accurate so would defer this unless we can add it on to his pre-blood transfusion specimen.  Hemoglobin A1c was 6.5% on pre-transfusion specimen.  Iron deficiency anemia secondary to blood loss (chronic)  Management per primary team.  Status post 2 units of packed red blood cells.  Ulcerative colitis  On steroids per primary team.    Subjective  No complaints.  Decreased abdominal cramping, bloody stool.   Objective   Objective: Filed Vitals:   07/03/11 0425 07/03/11 1418 07/03/11 2235 07/04/11 0506  BP: 117/69 132/74 120/75 115/76  Pulse: 83 89 74 88  Temp: 98.2 F (36.8 C) 98.7 F (37.1 C) 98.2 F (36.8 C) 97.5 F (36.4 C)  TempSrc: Oral Oral Oral Oral  Resp: 18 18 16 18   Height:      Weight:      SpO2: 92% 100% 100% 100%    Intake/Output Summary (Last 24 hours) at 07/04/11 1004 Last data filed at 07/03/11 1800  Gross per 24 hour  Intake   1500 ml   Output      0 ml  Net   1500 ml    Exam: Gen:  NAD Cardiovascular:  RRR, No M/R/G Respiratory: Lungs CTAB Gastrointestinal: Abdomen soft, NT/ND with normal active bowel sounds. Extremities: No C/E/C  Data Reviewed: Basic Metabolic Panel:  Lab 83/41/96 0705 07/02/11 1013  NA 136 137  K 4.5 3.7  CL 102 104  CO2 26 26  GLUCOSE 195* 212*  BUN 6 8  CREATININE 0.70 0.7  CALCIUM 8.5 8.0*  MG -- --  PHOS -- --   GFR Estimated Creatinine Clearance: 129.4 ml/min (by C-G formula based on Cr of 0.7). Liver Function Tests:  Lab 07/02/11 1013  AST 11  ALT 11  ALKPHOS 46  BILITOT 0.2*  PROT 6.6  ALBUMIN 2.8*    CBC:  Lab 07/03/11 0730 07/02/11 1013  WBC 4.6 6.4  NEUTROABS -- 2.9  HGB 9.0* 7.3 Repeated and verified X2.*  HCT 28.2* 22.8 Repeated and verified X2.*  MCV 83.4 85.2  PLT 416* 451.0*   CBG:  Lab 07/04/11 0805 07/03/11 2200 07/03/11 1659 07/03/11 1118 07/03/11 0744  GLUCAP 302* 342* 387* 217* 165*    Procedures and Diagnostic Studies: No results found.  Scheduled Meds:    . insulin aspart  0-5 Units Subcutaneous QHS  . insulin aspart  0-9 Units Subcutaneous TID WC  . insulin aspart  4 Units Subcutaneous TID WC  . insulin glargine  15 Units Subcutaneous Daily  . methylPREDNISolone (SOLU-MEDROL)  injection  20 mg Intravenous Q12H  . DISCONTD: insulin aspart  0-5 Units Subcutaneous QHS  . DISCONTD: insulin aspart  0-9 Units Subcutaneous TID WC  . DISCONTD: insulin aspart  0-9 Units Subcutaneous TID WC   Continuous Infusions:    . 0.9 % NaCl with KCl 20 mEq / L 75 mL/hr at 07/04/11 0301      LOS: 2 days   Jacquelynn Cree, MD Pager [phone]  07/04/2011, 10:04 AM

## 2011-07-04 NOTE — Progress Notes (Signed)
[facility] Progress Note  SUBJECTIVE: Diarrhea significantly better, bleeding almost resolved.  OBJECTIVE:  Vital signs in last 24 hours: Temp:  [97.5 F (36.4 C)-98.7 F (37.1 C)] 97.5 F (36.4 C) (06/20 0506) Pulse Rate:  [74-89] 88  (06/20 0506) Resp:  [16-18] 18  (06/20 0506) BP: (115-132)/(74-76) 115/76 mmHg (06/20 0506) SpO2:  [100 %] 100 % (06/20 0506) Last BM Date: 07/02/11 General:   Pleasant white male in NAD Heart:  Regular rate and rhythm; no murmurs Lungs: Respirations even and unlabored, lungs CTA bilaterally Abdomen:  Soft, nontender and nondistended. Normal bowel sounds. Extremities:  Without edema. Neurologic:  Alert and oriented,  grossly normal neurologically. Psych:  Cooperative. Normal mood and affect.   Lab Results:  Basename 07/03/11 0730 07/02/11 1013  WBC 4.6 6.4  HGB 9.0* 7.3 Repeated and verified X2.*  HCT 28.2* 22.8 Repeated and verified X2.*  PLT 416* 451.0*   BMET  Basename 07/03/11 0705 07/02/11 1013  NA 136 137  K 4.5 3.7  CL 102 104  CO2 26 26  GLUCOSE 195* 212*  BUN 6 8  CREATININE 0.70 0.7  CALCIUM 8.5 8.0*   LFT  Basename 07/02/11 1013  PROT 6.6  ALBUMIN 2.8*  AST 11  ALT 11  ALKPHOS 46  BILITOT 0.2*  BILIDIR --  IBILI --    ASSESSMENT / PLAN:  1.  Left sided UC flare, bloody diarrhea greatly improved on IV steroids. Patient looks okay, abdominal exam is benign. He has stable to be discharged on oral steroids and followup with Dr. Ardis Hughs. Recommend low fiber diet for now. 2.  Anemia, improved. Suspect iron deficient , iron studies pending. Hgb up to 9.0 from 7.3 after 2 units of PRBC. If iron deficient, will call patient in the next couple of days to start oral iron. 3. Steroid-induced hyperglycemia. Appreciate hospitalist help. Patient will go home on insulin regimen which is to be prescribed by the hospitalist (Dr. Rockne Menghini), who is seeing him right now.  .    LOS: 2 days   Tye Savoy  07/04/2011,  9:26 AM

## 2011-07-04 NOTE — Discharge Summary (Signed)
[facility] Discharge Summary  Name: Kenneth [name] MRN: [id number] DOB: 1975-05-27 36 y.o. PCP:  Scarlette Calico, MD  Date of Admission: 07/02/2011  3:27 PM Date of Discharge: 07/04/2011 Attending Physician: Silvano Rusk, MD Primary Gastroenterologist: Owens Loffler, MD Discharging Physician: Silvano Rusk, MD  Discharge Diagnosis: 1. Ulcerative colitis flare, improved  2. Hyperglycemia, steroid induced.   3. Anemia, likely iron deficient but iron studies pending. Improved.   Consultations: [facility] for management of hyperglycemia  Procedures Performed:  No results found.  GI Procedures: none  History/Physical Exam:  See Admission H&P  Admission HPI: Done by Dr. Ardis Hughs This is a very pleasant 36 year old man whom I last saw about 2-1/2 years ago. His wife is one of the oncology nurses. 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 He has been reluctant to try maintenance medicines for his ulcerative colitis and actually has been doing just fine for the past 2 years on no therapy at all.  Started bleeding 10 weeks ago, badly in past 6 weeks. Lots of blood ("tons of blood"). Having back pains lately . His HR has been up, noticed by his wife, Therapist, sports. Having mild abdominal cramps. He probably has 10-15 bloody bowel movements a day lately getting up in the middle the night. He is not sleeping at all. His insulin requirements resumed about 3 months ago and are climbing.   Labs last week: cmet normal except low alb, hb 8.5, plt [place] by problem list: 1. Ulcerative colitis flare. Patient was admitted in the afternoon to a medical floor for IV steroids and a blood transfusion. The next morning he was already feeling better with less bloody diarrhea and less abdominal discomfort. His diet was advanced without incident.  The following morning patient had further improvement in his symptoms. His physical exam was benign. Patient inquired about going home which seemed  reasonable. He would take Prednisone 20 mg BID until appointment with Dr. Ardis Hughs at which time maintenance medications would be initiated.     2. Hyperglycemia, steroid induced.  CBGs ranged from 160s to 420 this admission. Hospitalist consulted to manage hyperglycemia this admission.  The patient was prescribed an insulin regimen to use at home while on steroids. He understands that insulin requirements will likely decrease as steroid dose decreases.   3. Anemia, probably iron deficient but labs pending. Hemoglobin up from 7.3 to 9.0 with 2 units of PRBC.   Discharge Vitals:  BP 115/76  Pulse 88  Temp 97.5 F (36.4 C) (Oral)  Resp 18  Ht 6' 1"  (1.854 m)  Wt 155 lb (70.308 kg)  BMI 20.45 kg/m2  SpO2 100%  Discharge Labs:  Results for orders placed during the hospital encounter of 07/02/11 (from the past 24 hour(s))  GLUCOSE, CAPILLARY     Status: Abnormal   Collection Time   07/03/11  4:59 PM      Component Value Range   Glucose-Capillary 387 (*) 70 - 99 mg/dL   Comment 1 Documented in Chart     Comment 2 Notify RN    GLUCOSE, CAPILLARY     Status: Abnormal   Collection Time   07/03/11 10:00 PM      Component Value Range   Glucose-Capillary 342 (*) 70 - 99 mg/dL   Comment 1 Notify RN     Comment 2 Call MD NNP PA CNM    GLUCOSE, CAPILLARY     Status: Abnormal   Collection Time   07/04/11  8:05 AM      Component Value Range   Glucose-Capillary 302 (*) 70 - 99 mg/dL   Comment 1 Documented in Chart     Comment 2 Notify RN      Disposition and follow-up:   Mr.Shrey Franchi was discharged from [facility] in stable condition.    Follow-up Appointments: Discharge Orders    Future Appointments: Provider: Department: Dept Phone: Center:   07/19/2011 1:45 PM Milus Banister, MD [facility] [phone] [facility]     Future Orders Please Complete By Expires   Diet - low sodium heart healthy      Scheduling Instructions:   Low fiber   Increase activity slowly       Discharge instructions      Comments:   Increase your lantus dose by 2 units and your meal coverage by 1 unit if your fasting sugars remain greater than 160.  When your steroids are reduced, start to lower by the same amount.      Discharge Medications: Medication List  As of 07/04/2011 11:58 AM   TAKE these medications         HYDROcodone-acetaminophen 5-325 MG per tablet   Commonly known as: NORCO   Take 1-2 tablets by mouth every 4 (four) hours as needed for pain.      insulin aspart 100 UNIT/ML injection   Commonly known as: novoLOG   Inject 0-5 Units into the skin at bedtime. Bedtime coverage: CBG 70 -200 (dose in units): 0 units;  CBG 201 - 250: 2 units; CBG 251 - 300: 3 units; CBG 301 - 350: 4 units; CBG 351 - 400: 5 units;CBG > 400 Call MD      insulin aspart 100 UNIT/ML injection   Commonly known as: novoLOG   6 units for every meal + sliding scale: CBG 70-120: 0 u; CBG 121-150: 3 u; CBG 151-200: 4 u; CBG 201-250: 7 u; CBG 251-300: 11 u;CBG 301-350: 15 u; CBG 351-400: 20 u; CBG > 400: call MD      insulin glargine 100 UNIT/ML injection   Commonly known as: LANTUS   Inject 25 Units into the skin every morning.      multivitamin tablet   Take 1 tablet by mouth daily.      predniSONE 10 MG tablet   Commonly known as: STERAPRED UNI-PAK   Take 2 tablets (20 mg total) by mouth 2 (two) times daily.            Signed: Tye Savoy 07/04/2011, 11:58 AM

## 2011-07-04 NOTE — Progress Notes (Signed)
Agree with Ms. Guenther's assessment and plan. Gatha Mayer, MD, [facility]

## 2011-07-04 NOTE — Progress Notes (Signed)
Discharge instructions given.  Verbalized understanding.  Left via walking with spouse.

## 2011-07-08 ENCOUNTER — Ambulatory Visit: Payer: 59 | Admitting: Gastroenterology

## 2011-07-09 NOTE — Discharge Summary (Signed)
Agree with Ms. Guenther's assessment and plan. Gatha Mayer, MD, [facility]

## 2011-07-19 ENCOUNTER — Encounter: Payer: Self-pay | Admitting: Gastroenterology

## 2011-07-19 ENCOUNTER — Ambulatory Visit (INDEPENDENT_AMBULATORY_CARE_PROVIDER_SITE_OTHER): Payer: 59 | Admitting: Gastroenterology

## 2011-07-19 VITALS — BP 122/70 | HR 87 | Ht 73.0 in | Wt 165.0 lb

## 2011-07-19 DIAGNOSIS — K5190 Ulcerative colitis, unspecified, without complications: Secondary | ICD-10-CM

## 2011-07-19 MED ORDER — MESALAMINE 1.2 G PO TBEC: 4.8000 g | DELAYED_RELEASE_TABLET | Freq: Every day | ORAL | Status: DC

## 2011-07-19 MED ORDER — PREDNISONE 10 MG PO TABS: 20.0000 mg | ORAL_TABLET | Freq: Two times a day (BID) | ORAL | Status: AC

## 2011-07-19 NOTE — Patient Instructions (Addendum)
Start tapering prednisone by 22m a week starting in 2 weeks. Crohn's and colitis foundation of [place] is a good rSet designer4 pills once daily Return mid to late August, call if troubles.

## 2011-07-19 NOTE — Progress Notes (Signed)
Review of pertinent gastrointestinal problems:  1. Left sided UC: Colonoscopy Dr. Penelope Coop 2006 up to 30cm. Mesalamine products did not seem to help very well. Significant flare summer 2009 steroids started. Had extreme hyperglycemia on steroids requiring insulin. Repeat colonoscopy December, 2009 showed inflammation moderate To 70 cm. Kenneth [name] was started on sulfasalazine. Had drug rash, likely pneumonitis, LFT abnormalities ( transaminases up to almost 1000, bili up to 6). Holding his medicines resulted in complete resolution of abnormal labs checked serially. TPMT testing January, 2010 showed normal enzyme activity. February, 2010 tapered off all medicines and feels very well. May, 2010: flaring of symptoms for the past month, Kenneth [name] went to see a [place] herbalist, did not start the oral mesalamine that I recommended, restarted prednisone. September, 2010 self-medicating with prednisone however tapering off. January, 2011, : doing well off traditional IBD medicines, taking Chinese Herb.  05/2011 severe flare, (abd pain, weight loss, bloody diarrhea Hb 6s), required admission, iv steroids, blood transfusion.    HPI: [name]  I admitted him to the hospital with a severe colitis flare.  2 nights in hosp, 2 units blood.    3-4 semiformed stools a day, no nocturnal.  Kenneth [name] is not yet back to completely normal but Kenneth [name] is much better than prior to hospital admission, steroids  Kenneth [name] is here with his wife today.   Review of systems: Pertinent positive and negative review of systems were noted in the above HPI section. Complete review of systems was performed and was otherwise normal.    Past Medical History  Diagnosis Date  . Colitis   . Steroid-induced hyperglycemia     Past Surgical History  Procedure Date  . Left thumb surgery   . Hernia repair 2010    left  . Wisdom tooth extraction 11/2010    Current Outpatient Prescriptions  Medication  Sig Dispense Refill  . insulin aspart (NOVOLOG) 100 UNIT/ML injection Inject 0-5 Units into the skin at bedtime. Bedtime coverage: CBG 70 -200 (dose in units): 0 units;  CBG 201 - 250: 2 units; CBG 251 - 300: 3 units; CBG 301 - 350: 4 units; CBG 351 - 400: 5 units;CBG > 400 Call MD  1 vial    . insulin aspart (NOVOLOG) 100 UNIT/ML injection 6 units for every meal + sliding scale: CBG 70-120: 0 u; CBG 121-150: 3 u; CBG 151-200: 4 u; CBG 201-250: 7 u; CBG 251-300: 11 u;CBG 301-350: 15 u; CBG 351-400: 20 u; CBG > 400: call MD  1 vial  12  . insulin glargine (LANTUS) 100 UNIT/ML injection Inject 25 Units into the skin every morning.  10 mL  2  . Multiple Vitamin (MULTIVITAMIN) tablet Take 1 tablet by mouth daily.      . predniSONE (DELTASONE) 20 MG tablet Take 20 mg by mouth daily.        Allergies as of 07/19/2011 - Review Complete 07/19/2011  Allergen Reaction Noted  . Moxifloxacin Other (See Comments)   . Sulfonamide derivatives Other (See Comments)     History reviewed. No pertinent family history.  History   Social History  . Marital Status: Married    Spouse Name: N/A    Number of Children: 0  . 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 Smoking status: Former Smoker    Types: Cigarettes    Quit date: 07/01/2004  .  Smokeless tobacco: Never Used  . Alcohol Use: Yes     social  . Drug Use: No  . Sexually Active: No   Other Topics Concern  . Not on file   Social History Narrative  . No narrative on file       Physical Exam: BP 122/70  Pulse 87  Ht 6' 1"  (1.854 m)  Wt 165 lb (74.844 kg)  BMI 21.77 kg/m2  SpO2 98% Constitutional: generally well-appearing Psychiatric: alert and oriented x3 Eyes: extraocular movements intact Mouth: oral pharynx moist, no lesions Neck: supple no lymphadenopathy Cardiovascular: heart regular rate and rhythm Lungs: clear to auscultation bilaterally Abdomen: soft, nontender,  nondistended, no obvious ascites, no peritoneal signs, normal bowel sounds Extremities: no lower extremity edema bilaterally Skin: no lesions on visible extremities    Assessment and plan: 36 y.o. male with  left-sided colitis  I recommended Kenneth [name] start immune no modulators with azathioprine dosing. Kenneth [name] is reluctant to make that jump yet and instead wants to retry mesalamine products a higher dose, more regularly. Kenneth [name] did initially try the several years ago and had incomplete response. If it is reasonable. Kenneth [name] will therefore start the all the full-strength once daily and will slowly start tapering his steroids by 5 mg a week beginning in 2 weeks. Kenneth [name]'ll return to see me in mid August.

## 2011-07-23 ENCOUNTER — Telehealth: Payer: Self-pay | Admitting: Gastroenterology

## 2011-07-23 NOTE — Telephone Encounter (Signed)
Pt is having rectal pain and has a swollen area in his rectum started yesterday.  Bowels are normal, he is not constipated.  Please advise

## 2011-07-23 NOTE — Telephone Encounter (Signed)
Needs rov tomorrow (extender or double book with me if needed).  Concern about perirectal abscess.

## 2011-07-23 NOTE — Telephone Encounter (Signed)
Pt has been added to Dr Christella Hartigan schedule for 07/24/11 330 pm pt aware

## 2011-07-24 ENCOUNTER — Encounter: Payer: Self-pay | Admitting: Gastroenterology

## 2011-07-24 ENCOUNTER — Ambulatory Visit (INDEPENDENT_AMBULATORY_CARE_PROVIDER_SITE_OTHER): Payer: 59 | Admitting: Gastroenterology

## 2011-07-24 VITALS — BP 108/54 | HR 88 | Ht 73.0 in | Wt 162.1 lb

## 2011-07-24 DIAGNOSIS — K649 Unspecified hemorrhoids: Secondary | ICD-10-CM

## 2011-07-24 MED ORDER — PRAMOXINE-HC 1-1 % EX CREA: TOPICAL_CREAM | CUTANEOUS | Status: DC

## 2011-07-24 NOTE — Progress Notes (Signed)
Review of pertinent gastrointestinal problems:  1. Left sided UC: Colonoscopy Dr. Penelope Coop 2006 up to 30cm. Mesalamine products did not seem to help very well. Significant flare summer 2009 steroids started. Had extreme hyperglycemia on steroids requiring insulin. Repeat colonoscopy December, 2009 showed inflammation moderate To 70 cm. He was started on sulfasalazine. Had drug rash, likely pneumonitis, LFT abnormalities ( transaminases up to almost 1000, bili up to 6). Holding his medicines resulted in complete resolution of abnormal labs checked serially. TPMT testing January, 2010 showed normal enzyme activity. February, 2010 tapered off all medicines and feels very well. May, 2010: flaring of symptoms for the past month, He went to see a [place] herbalist, did not start the oral mesalamine that I recommended, restarted prednisone. September, 2010 self-medicating with prednisone however tapering off. January, 2011, : doing well off traditional IBD medicines, taking Chinese Herb. 05/2011 severe flare, (abd pain, weight loss, bloody diarrhea Hb 6s), required admission, iv steroids, blood transfusion.  2. swollen, somewhat thrombosed external hemorrhoid July 2013  HPI: This is a   very pleasant 36 year old man whom I last saw about a week ago. He comes in now with anal discomfort, swelling.  He's had no fevers or chills. Bowels are still doing well, no blood, he moves his bowels 2-3 times a day. Loose, semi-formed    Past Medical History  Diagnosis Date  . Colitis   . Steroid-induced hyperglycemia     Past Surgical History  Procedure Date  . Left thumb surgery   . Hernia repair 2010    left  . Wisdom tooth extraction 11/2010    Current Outpatient Prescriptions  Medication Sig Dispense Refill  . insulin aspart (NOVOLOG) 100 UNIT/ML injection 6 units for every meal + sliding scale: CBG 70-120: 0 u; CBG 121-150: 3 u; CBG 151-200: 4 u; CBG 201-250: 7 u; CBG 251-300: 11 u;CBG 301-350: 15 u; CBG  351-400: 20 u; CBG > 400: call MD  1 vial  12  . insulin glargine (LANTUS) 100 UNIT/ML injection Inject 25 Units into the skin every morning.  10 mL  2  . mesalamine (LIALDA) 1.2 G EC tablet Take 4 tablets (4.8 g total) by mouth daily with breakfast.  120 tablet  4  . Multiple Vitamin (MULTIVITAMIN) tablet Take 1 tablet by mouth daily.      . predniSONE (DELTASONE) 10 MG tablet Take 2 tablets (20 mg total) by mouth 2 (two) times daily.  100 tablet  4  . DISCONTD: insulin aspart (NOVOLOG) 100 UNIT/ML injection Inject 0-5 Units into the skin at bedtime. Bedtime coverage: CBG 70 -200 (dose in units): 0 units;  CBG 201 - 250: 2 units; CBG 251 - 300: 3 units; CBG 301 - 350: 4 units; CBG 351 - 400: 5 units;CBG > 400 Call MD  1 vial      Allergies as of 07/24/2011 - Review Complete 07/24/2011  Allergen Reaction Noted  . Moxifloxacin Other (See Comments)   . Sulfonamide derivatives Other (See Comments)     History reviewed. No pertinent family history.  History   Social History  . Marital Status: Married    Spouse Name: N/A    Number of Children: 0  . 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 Smoking status: Former Smoker    Types: Cigarettes    Quit date: 07/01/2004  . Smokeless tobacco: Never Used  . Alcohol Use: Yes     social  .  Drug Use: No  . Sexually Active: No   Other Topics Concern  . Not on file   Social History Narrative  . No narrative on file      Physical Exam: BP 108/54  Pulse 88  Ht 6' 1"  (1.854 m)  Wt 162 lb 2 oz (73.539 kg)  BMI 21.39 kg/m2 Constitutional: generally well-appearing Psychiatric: alert and oriented x3 Abdomen: soft, nontender, nondistended, no obvious ascites, no peritoneal signs, normal bowel sounds Rectal exam: 1.5-2 cm swollen, slightly tender external anal hemorrhoid. No fluctuance, no perianal abscess.    Assessment and plan: 36 y.o. male with swollen external hemorrhoids  He  will begin sitz baths twice daily. I called in anal ointment to apply twice to 3 times daily. He is going to start fiber supplements as this might help bulk his stools at that. He knows to call if things worsen.

## 2011-07-24 NOTE — Patient Instructions (Addendum)
Sitz baths twice daily. Please start taking citrucel (orange flavored) powder fiber supplement.  This may cause some bloating at first but that usually goes away. Begin with a small spoonful and work your way up to a large, heaping spoonful daily over a week. Anal ointment called in, apply this twice daily.

## 2011-09-30 ENCOUNTER — Ambulatory Visit (INDEPENDENT_AMBULATORY_CARE_PROVIDER_SITE_OTHER): Payer: 59 | Admitting: Gastroenterology

## 2011-09-30 ENCOUNTER — Encounter: Payer: Self-pay | Admitting: Gastroenterology

## 2011-09-30 VITALS — BP 124/78 | HR 80 | Ht 73.0 in | Wt 177.0 lb

## 2011-09-30 DIAGNOSIS — K529 Noninfective gastroenteritis and colitis, unspecified: Secondary | ICD-10-CM

## 2011-09-30 DIAGNOSIS — K5289 Other specified noninfective gastroenteritis and colitis: Secondary | ICD-10-CM

## 2011-09-30 NOTE — Progress Notes (Signed)
Review of pertinent gastrointestinal problems:  1. Left sided UC: Colonoscopy Dr. Penelope Coop 2006 up to 30cm. Mesalamine products did not seem to help very well. Significant flare summer 2009 steroids started. Had extreme hyperglycemia on steroids requiring insulin. Repeat colonoscopy December, 2009 showed inflammation moderate To 70 cm. He was started on sulfasalazine. Had drug rash, likely pneumonitis, LFT abnormalities ( transaminases up to almost 1000, bili up to 6). Holding his medicines resulted in complete resolution of abnormal labs checked serially. TPMT testing January, 2010 showed normal enzyme activity. February, 2010 tapered off all medicines and feels very well. May, 2010: flaring of symptoms for the past month, He went to see a [place] herbalist, did not start the oral mesalamine that I recommended, restarted prednisone. September, 2010 self-medicating with prednisone however tapering off. January, 2011, : doing well off traditional IBD medicines, taking Chinese Herb. 05/2011 severe flare, (abd pain, weight loss, bloody diarrhea Hb 6s), required admission, iv steroids, blood transfusion.  2. swollen, somewhat thrombosed external hemorrhoid July 2013  HPI: This is a  very pleasant 36 year old man who is here with his wife today. I last saw him about 2 months ago.  Hiking, campling 3 week trip in [place] back from [place].    Bowels have been normal.  No bleeding.  No diarrhrea, normal solid stools.  No abd pains.  Completed prednisone as of yesterday.  Hemorrhoid improved, gone now.  On lialda 4 pills daily, good about taking them.   Past Medical History  Diagnosis Date  . Colitis   . Steroid-induced hyperglycemia     Past Surgical History  Procedure Date  . Left thumb surgery   . Hernia repair 2010    left  . Wisdom tooth extraction 11/2010    Current Outpatient Prescriptions  Medication Sig Dispense Refill  . insulin aspart (NOVOLOG) 100 UNIT/ML injection sliding scale: CBG  70-120: 0 u; CBG 121-150: 3 u; CBG 151-200: 4 u; CBG 201-250: 7 u; CBG 251-300: 11 u;CBG 301-350: 15 u; CBG 351-400: 20 u; CBG > 400: call MD      . insulin glargine (LANTUS) 100 UNIT/ML injection Inject 20 Units into the skin every morning.      . mesalamine (LIALDA) 1.2 G EC tablet Take 4 tablets (4.8 g total) by mouth daily with breakfast.  120 tablet  4  . Multiple Vitamin (MULTIVITAMIN) tablet Take 1 tablet by mouth daily.      Marland Kitchen DISCONTD: insulin aspart (NOVOLOG) 100 UNIT/ML injection 6 units for every meal + sliding scale: CBG 70-120: 0 u; CBG 121-150: 3 u; CBG 151-200: 4 u; CBG 201-250: 7 u; CBG 251-300: 11 u;CBG 301-350: 15 u; CBG 351-400: 20 u; CBG > 400: call MD  1 vial  12  . DISCONTD: insulin glargine (LANTUS) 100 UNIT/ML injection Inject 25 Units into the skin every morning.  10 mL  2    Allergies as of 09/30/2011 - Review Complete 09/30/2011  Allergen Reaction Noted  . Moxifloxacin Other (See Comments)   . Sulfonamide derivatives Other (See Comments)     No family history on file.  History   Social History  . Marital Status: Married    Spouse Name: N/A    Number of Children: 0  . 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 Smoking status: Former Smoker    Types: Cigarettes    Quit date: 07/01/2004  . Smokeless tobacco: Never Used  .  Alcohol Use: Yes     social  . Drug Use: No  . Sexually Active: No   Other Topics Concern  . Not on file   Social History Narrative  . No narrative on file      Physical Exam: BP 124/78  Pulse 80  Ht 6' 1"  (1.854 m)  Wt 177 lb (80.287 kg)  BMI 23.35 kg/m2 Constitutional: generally well-appearing Psychiatric: alert and oriented x3 Abdomen: soft, nontender, nondistended, no obvious ascites, no peritoneal signs, normal bowel sounds     Assessment and plan: 36 y.o. male with left-sided ulcerative colitis  He is doing well currently on full dose mesalamine. I would've  expected that he be off prednisone a bit sooner than he is, he did stop yesterday. Still appears somewhat cushingoid. I hope that his symptoms will be under good control on mesalamine alone for a long time. He has Y. far he asked about when to stop at given his history of not taking it for at least a year or 2 from now. He'll return to see me in 3 months and sooner if needed.

## 2011-09-30 NOTE — Patient Instructions (Addendum)
Stay on lialda 4 pills once a day. Return to see Dr. Ardis Hughs in 3-4 months, sooner if needed.

## 2011-11-26 ENCOUNTER — Telehealth: Payer: Self-pay | Admitting: Gastroenterology

## 2011-11-26 MED ORDER — MESALAMINE 1.2 G PO TBEC: 4.8000 g | DELAYED_RELEASE_TABLET | Freq: Every day | ORAL | Status: DC

## 2011-11-26 NOTE — Telephone Encounter (Signed)
Pt rx has been sent and he is aware  

## 2012-03-23 ENCOUNTER — Other Ambulatory Visit: Payer: Self-pay | Admitting: Hematology & Oncology

## 2012-10-19 ENCOUNTER — Encounter: Payer: Self-pay | Admitting: Gastroenterology

## 2012-10-19 NOTE — Telephone Encounter (Signed)
Error

## 2012-10-20 ENCOUNTER — Ambulatory Visit (INDEPENDENT_AMBULATORY_CARE_PROVIDER_SITE_OTHER): Payer: BC Managed Care – PPO | Admitting: Gastroenterology

## 2012-10-20 ENCOUNTER — Encounter: Payer: Self-pay | Admitting: Gastroenterology

## 2012-10-20 ENCOUNTER — Other Ambulatory Visit (INDEPENDENT_AMBULATORY_CARE_PROVIDER_SITE_OTHER): Payer: BC Managed Care – PPO

## 2012-10-20 VITALS — BP 128/70 | HR 72 | Ht 71.65 in | Wt 176.2 lb

## 2012-10-20 DIAGNOSIS — K5190 Ulcerative colitis, unspecified, without complications: Secondary | ICD-10-CM

## 2012-10-20 LAB — CBC WITH DIFFERENTIAL/PLATELET
Basophils Absolute: 0 10*3/uL (ref 0.0–0.1)
Basophils Relative: 0.2 % (ref 0.0–3.0)
Eosinophils Absolute: 0 10*3/uL (ref 0.0–0.7)
Eosinophils Relative: 0.5 % (ref 0.0–5.0)
HCT: 38.8 % — ABNORMAL LOW (ref 39.0–52.0)
Hemoglobin: 13 g/dL (ref 13.0–17.0)
Lymphocytes Relative: 16 % (ref 12.0–46.0)
Lymphs Abs: 1.3 10*3/uL (ref 0.7–4.0)
MCHC: 33.5 g/dL (ref 30.0–36.0)
MCV: 83.6 fl (ref 78.0–100.0)
Monocytes Absolute: 0.7 10*3/uL (ref 0.1–1.0)
Monocytes Relative: 8.5 % (ref 3.0–12.0)
Neutro Abs: 6.3 10*3/uL (ref 1.4–7.7)
Neutrophils Relative %: 74.8 % (ref 43.0–77.0)
Platelets: 372 10*3/uL (ref 150.0–400.0)
RBC: 4.64 Mil/uL (ref 4.22–5.81)
RDW: 17 % — ABNORMAL HIGH (ref 11.5–14.6)
WBC: 8.4 10*3/uL (ref 4.5–10.5)

## 2012-10-20 LAB — COMPREHENSIVE METABOLIC PANEL
ALT: 15 U/L (ref 0–53)
AST: 14 U/L (ref 0–37)
Albumin: 3.8 g/dL (ref 3.5–5.2)
Alkaline Phosphatase: 52 U/L (ref 39–117)
BUN: 18 mg/dL (ref 6–23)
CO2: 29 mEq/L (ref 19–32)
Calcium: 8.8 mg/dL (ref 8.4–10.5)
Chloride: 102 mEq/L (ref 96–112)
Creatinine, Ser: 1 mg/dL (ref 0.4–1.5)
GFR: 91.04 mL/min (ref >60.00)
Glucose, Bld: 250 mg/dL — ABNORMAL HIGH (ref 70–99)
Potassium: 4.2 mEq/L (ref 3.5–5.1)
Sodium: 137 mEq/L (ref 135–145)
Total Bilirubin: 0.4 mg/dL (ref 0.3–1.2)
Total Protein: 7.2 g/dL (ref 6.0–8.3)

## 2012-10-20 MED ORDER — PREDNISONE 20 MG PO TABS: 20.0000 mg | ORAL_TABLET | Freq: Two times a day (BID) | ORAL | Status: DC

## 2012-10-20 MED ORDER — MESALAMINE 1.2 G PO TBEC: 4.8000 g | DELAYED_RELEASE_TABLET | Freq: Every day | ORAL | Status: DC

## 2012-10-20 NOTE — Progress Notes (Signed)
Review of pertinent gastrointestinal problems:  1. Left sided UC: Colonoscopy Dr. Penelope Coop 2006 up to 30cm. Mesalamine products did not seem to help very well. Significant flare summer 2009 steroids started. Had extreme hyperglycemia on steroids requiring insulin. Repeat colonoscopy December, 2009 showed inflammation moderate To 70 cm. He was started on sulfasalazine. Had drug rash, likely pneumonitis, LFT abnormalities ( transaminases up to almost 1000, bili up to 6). Holding his medicines resulted in complete resolution of abnormal labs checked serially. TPMT testing January, 2010 showed normal enzyme activity. February, 2010 tapered off all medicines and feels very well. May, 2010: flaring of symptoms for the past month, He went to see a [place] herbalist, did not start the oral mesalamine that I recommended, restarted prednisone. September, 2010 self-medicating with prednisone however tapering off. January, 2011, : doing well off traditional IBD medicines, taking Chinese Herb. 05/2011 severe flare, (abd pain, weight loss, bloody diarrhea Hb 6s), required admission, iv steroids, blood transfusion.  2. swollen, somewhat thrombosed external hemorrhoid July 2013   HPI: This is a   very pleasant 37 year old man whom I last saw about a year ago.  He tapered off his prednisone since then and was on maintenance mesalamine until about 4-5 months ago. Starting about 2 weeks ago he began to have a flare of bloody diarrhea. Mild abdominal pains as well.  Bloody diarrhea for past 2 weeks.  5-7 times per day, no nocturnal symptoms.  Back and sternum   Restarted prednisone 20 bid for 4 days, has helped quickly.  Had been on vitamins, quit taking mesalamine 4-5 months ago.   Past Medical History  Diagnosis Date  . Colitis   . Steroid-induced hyperglycemia     Past Surgical History  Procedure Laterality Date  . Left thumb surgery    . Hernia repair  2010    left  . Wisdom tooth extraction  11/2010     Current Outpatient Prescriptions  Medication Sig Dispense Refill  . ferrous sulfate 325 (65 FE) MG tablet Take 325 mg by mouth daily with breakfast.      . insulin aspart (NOVOLOG) 100 UNIT/ML injection sliding scale: CBG 70-120: 0 u; CBG 121-150: 3 u; CBG 151-200: 4 u; CBG 201-250: 7 u; CBG 251-300: 11 u;CBG 301-350: 15 u; CBG 351-400: 20 u; CBG > 400: call MD      . insulin glargine (LANTUS) 100 UNIT/ML injection Inject 20 Units into the skin every morning.      . Multiple Vitamin (MULTIVITAMIN) tablet Take 1 tablet by mouth daily.      . ONE TOUCH ULTRA TEST test strip USE TO TEST BLOOD SUGAR 4 TIMES A DAY BEFORE MEALS AND AT BEDTIME  100 each  PRN  . predniSONE (DELTASONE) 20 MG tablet Take 20 mg by mouth 2 (two) times daily.       No current facility-administered medications for this visit.    Allergies as of 10/20/2012 - Review Complete 10/20/2012  Allergen Reaction Noted  . Moxifloxacin Other (See Comments)   . Sulfonamide derivatives Other (See Comments)     History reviewed. No pertinent family history.  History   Social History  . Marital Status: Married    Spouse Name: N/A    Number of Children: 0  . 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 Smoking status: Former Smoker    Types: Cigarettes    Quit date: 07/01/2004  . Smokeless tobacco: Never  Used  . Alcohol Use: Yes     Comment: social  . Drug Use: No  . Sexual Activity: No   Other Topics Concern  . Not on file   Social History Narrative  . No narrative on file      Physical Exam: BP 128/70  Pulse 72  Ht 5' 11.65" (1.82 m)  Wt 176 lb 4 oz (79.946 kg)  BMI 24.14 kg/m2 Constitutional: generally well-appearing Psychiatric: alert and oriented x3 Abdomen: soft, nontender, nondistended, no obvious ascites, no peritoneal signs, normal bowel sounds     Assessment and plan: 37 y.o. male with  ulcerative colitis with flare of bloody diarrhea  He  started prednisone again  and noticed a brisk improvement since then the past 4 days or so. He is going to continue prednisone at 40 mg per day for another 2 weeks and then begin a long slow taper by 5 mg per week. This has worked for him in the past. He will restart his maintenance mesalamine (lialda at 4.8 g per day).  CBC and complete metabolic profile today and he'll return to see me in 4-6 weeks and sooner if needed.

## 2012-10-20 NOTE — Patient Instructions (Addendum)
Stay on prednisone 17m daily for 2 weeks, then start slow taper (536mper weeK). Restart lialda today (4 pills once daily). New script called in. You will have labs checked today in the basement lab.  Please head down after you check out with the front desk  (cbc, cmet). Please return to see Dr. JaArdis Hughsn 4-6 month.

## 2012-11-20 ENCOUNTER — Encounter: Payer: Self-pay | Admitting: Gastroenterology

## 2012-11-20 ENCOUNTER — Ambulatory Visit (INDEPENDENT_AMBULATORY_CARE_PROVIDER_SITE_OTHER): Payer: BC Managed Care – PPO | Admitting: Gastroenterology

## 2012-11-20 VITALS — BP 130/70 | HR 100 | Ht 71.6 in | Wt 179.5 lb

## 2012-11-20 DIAGNOSIS — K51919 Ulcerative colitis, unspecified with unspecified complications: Secondary | ICD-10-CM

## 2012-11-20 DIAGNOSIS — K5190 Ulcerative colitis, unspecified, without complications: Secondary | ICD-10-CM

## 2012-11-20 NOTE — Progress Notes (Signed)
Review of pertinent gastrointestinal problems:  1. Left sided UC: Colonoscopy Dr. Penelope Coop 2006 up to 30cm. Mesalamine products did not seem to help very well. Significant flare summer 2009 steroids started. Had extreme hyperglycemia on steroids requiring insulin. Repeat colonoscopy December, 2009 showed inflammation moderate To 70 cm. He was started on sulfasalazine. Had drug rash, likely pneumonitis, LFT abnormalities ( transaminases up to almost 1000, bili up to 6). Holding his medicines resulted in complete resolution of abnormal labs checked serially. TPMT testing January, 2010 showed normal enzyme activity. February, 2010 tapered off all medicines and feels very well. May, 2010: flaring of symptoms for the past month, He went to see a [place] herbalist, did not start the oral mesalamine that I recommended, restarted prednisone. September, 2010 self-medicating with prednisone however tapering off. January, 2011, : doing well off traditional IBD medicines, taking Chinese Herb. 05/2011 severe flare, (abd pain, weight loss, bloody diarrhea Hb 6s), required admission, iv steroids, blood transfusion. October 2014: flare of symptoms (had stopped mesalamine 4 months prior);  2. swollen, somewhat thrombosed external hemorrhoid July 2013   HPI: This is a   very pleasant 37 year old man whom I last saw one month ago when he had started to have symptoms of colitis flare.  Currently on pred 10 bid.    Has been on steroids for 4-5 weeks no.  No bleeding, that stopped within 1 week.  The urgency is gone.        Past Medical History  Diagnosis Date  . Colitis   . Steroid-induced hyperglycemia     Past Surgical History  Procedure Laterality Date  . Left thumb surgery    . Hernia repair  2010    left  . Wisdom tooth extraction  11/2010    Current Outpatient Prescriptions  Medication Sig Dispense Refill  . ferrous sulfate 325 (65 FE) MG tablet Take 325 mg by mouth daily with breakfast.      . insulin  aspart (NOVOLOG) 100 UNIT/ML injection sliding scale: CBG 70-120: 0 u; CBG 121-150: 3 u; CBG 151-200: 4 u; CBG 201-250: 7 u; CBG 251-300: 11 u;CBG 301-350: 15 u; CBG 351-400: 20 u; CBG > 400: call MD      . insulin glargine (LANTUS) 100 UNIT/ML injection Inject 20 Units into the skin every morning.      . mesalamine (LIALDA) 1.2 G EC tablet Take 4 tablets (4.8 g total) by mouth daily with breakfast.  120 tablet  6  . Multiple Vitamin (MULTIVITAMIN) tablet Take 1 tablet by mouth daily.      . ONE TOUCH ULTRA TEST test strip USE TO TEST BLOOD SUGAR 4 TIMES A DAY BEFORE MEALS AND AT BEDTIME  100 each  PRN  . predniSONE (DELTASONE) 20 MG tablet Take 1 tablet (20 mg total) by mouth 2 (two) times daily.  60 tablet  1   No current facility-administered medications for this visit.    Allergies as of 11/20/2012 - Review Complete 11/20/2012  Allergen Reaction Noted  . Moxifloxacin Other (See Comments)   . Sulfonamide derivatives Other (See Comments)     History reviewed. No pertinent family history.  History   Social History  . Marital Status: Married    Spouse Name: N/A    Number of Children: 0  . 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 Smoking status: Former Smoker    Types: Cigarettes    Quit date: 07/01/2004  .  Smokeless tobacco: Never Used  . Alcohol Use: Yes     Comment: social  . Drug Use: No  . Sexual Activity: No   Other Topics Concern  . Not on file   Social History Narrative  . No narrative on file      Physical Exam: BP 130/70  Pulse 100  Ht 5' 11.6" (1.819 m)  Wt 179 lb 8 oz (81.421 kg)  BMI 24.61 kg/m2 Constitutional: generally well-appearing Psychiatric: alert and oriented x3 Abdomen: soft, nontender, nondistended, no obvious ascites, no peritoneal signs, normal bowel sounds     Assessment and plan: 37 y.o. male with left-sided colitis, flair symptoms are improving with steroids and resumption of  mesalamine  He'll continue tapering prednisone by 5 mg every week. He has significant troubles with his blood sugars when prednisone is started, recall and initial presentation of diabetes was when I started prednisone on an and he presented with DKA.  He'll continue mesalamine full-strength, Lilada 4 pills once daily. He'll return to see me in 2 months and sooner if needed.

## 2012-11-20 NOTE — Patient Instructions (Signed)
Continue to taper prednisone by 69m per week. Continue lialda 4 pills once daily. Please return to see Dr. JArdis Hughsin 2 months.

## 2013-01-29 ENCOUNTER — Ambulatory Visit (INDEPENDENT_AMBULATORY_CARE_PROVIDER_SITE_OTHER): Payer: BC Managed Care – PPO | Admitting: Gastroenterology

## 2013-01-29 ENCOUNTER — Encounter: Payer: Self-pay | Admitting: Gastroenterology

## 2013-01-29 VITALS — BP 128/80 | HR 78 | Ht 73.0 in | Wt 178.0 lb

## 2013-01-29 DIAGNOSIS — K5190 Ulcerative colitis, unspecified, without complications: Secondary | ICD-10-CM

## 2013-01-29 NOTE — Progress Notes (Signed)
Review of pertinent gastrointestinal problems:  1. Left sided UC: Colonoscopy Dr. Penelope Coop 2006 up to 30cm. Mesalamine products did not seem to help very well. Significant flare summer 2009 steroids started. Had extreme hyperglycemia on steroids requiring insulin. Repeat colonoscopy December, 2009 showed inflammation moderate To 70 cm. He was started on sulfasalazine. Had drug rash, likely pneumonitis, LFT abnormalities ( transaminases up to almost 1000, bili up to 6). Holding his medicines resulted in complete resolution of abnormal labs checked serially. TPMT testing January, 2010 showed normal enzyme activity. February, 2010 tapered off all medicines and feels very well. May, 2010: flaring of symptoms for the past month, He went to see a [place] herbalist, did not start the oral mesalamine that I recommended, restarted prednisone. September, 2010 self-medicating with prednisone however tapering off. January, 2011, : doing well off traditional IBD medicines, taking Chinese Herb. 05/2011 severe flare, (abd pain, weight loss, bloody diarrhea Hb 6s), required admission, iv steroids, blood transfusion. October 2014: flare of symptoms (had stopped mesalamine 4 months prior); 01/2013 flare improved with prednisone and restarting lialda 2. swollen, somewhat thrombosed external hemorrhoid July 2013   HPI: This is a   very pleasant 38 year old Kenneth [name] whom I last saw about 2-3 months ago.  Came off prednisone. Has been off completely for about a month.  Lialda 4 pills per day most day.  Has been taking tumeric supplement (2 grams per day).  Blood sugars recovering.    Has two BMs in AM, usually formed.  Non-bloody.    Past Medical History  Diagnosis Date  . Colitis   . Steroid-induced hyperglycemia     Past Surgical History  Procedure Laterality Date  . Left thumb surgery    . Hernia repair  2010    left  . Wisdom tooth extraction  11/2010    Current Outpatient Prescriptions  Medication Sig Dispense  Refill  . insulin aspart (NOVOLOG) 100 UNIT/ML injection sliding scale: CBG 70-120: 0 u; CBG 121-150: 3 u; CBG 151-200: 4 u; CBG 201-250: 7 u; CBG 251-300: 11 u;CBG 301-350: 15 u; CBG 351-400: 20 u; CBG > 400: call MD      . insulin glargine (LANTUS) 100 UNIT/ML injection Inject 20 Units into the skin every morning.      . mesalamine (LIALDA) 1.2 G EC tablet Take 4 tablets (4.8 g total) by mouth daily with breakfast.  120 tablet  6  . Multiple Vitamin (MULTIVITAMIN) tablet Take 1 tablet by mouth daily.      . ONE TOUCH ULTRA TEST test strip USE TO TEST BLOOD SUGAR 4 TIMES A DAY BEFORE MEALS AND AT BEDTIME  100 each  PRN  . ferrous sulfate 325 (65 FE) MG tablet Take 325 mg by mouth daily with breakfast.       No current facility-administered medications for this visit.    Allergies as of 01/29/2013 - Review Complete 01/29/2013  Allergen Reaction Noted  . Moxifloxacin Other (See Comments)   . Sulfonamide derivatives Other (See Comments)     History reviewed. No pertinent family history.  History   Social History  . Marital Status: Married    Spouse Name: N/A    Number of Children: 0  . 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 Smoking status: Former Smoker    Types: Cigarettes    Quit date: 07/01/2004  . Smokeless tobacco: Never Used  . Alcohol Use: Yes  Comment: social  . Drug Use: No  . Sexual Activity: No   Other Topics Concern  . Not on file   Social History Narrative  . No narrative on file      Physical Exam: BP 128/80  Pulse 78  Ht 6' 1"  (1.854 m)  Wt 178 lb (80.74 kg)  BMI 23.49 kg/m2 Constitutional: generally well-appearing Psychiatric: alert and oriented x3 Abdomen: soft, nontender, nondistended, no obvious ascites, no peritoneal signs, normal bowel sounds     Assessment and plan: 38 y.o. male with ulcerative colitis  He is doing very well on mesalamine full dose. He will continue this. He'll  return to see me in 6 months and sooner if needed. 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

## 2013-01-29 NOTE — Patient Instructions (Signed)
Please return to see Dr. Ardis Hughs in 6 months, sooner if needed.

## 2013-04-09 ENCOUNTER — Other Ambulatory Visit: Payer: Self-pay | Admitting: Hematology & Oncology

## 2013-04-21 ENCOUNTER — Ambulatory Visit: Payer: BC Managed Care – PPO | Admitting: Physician Assistant

## 2013-04-21 DIAGNOSIS — Z0289 Encounter for other administrative examinations: Secondary | ICD-10-CM

## 2013-05-13 ENCOUNTER — Other Ambulatory Visit: Payer: Self-pay | Admitting: Hematology & Oncology

## 2013-07-05 ENCOUNTER — Telehealth: Payer: Self-pay | Admitting: Gastroenterology

## 2013-07-06 NOTE — Telephone Encounter (Signed)
That is ok.  I last saw him 01/2013 he was doing well at that point and it sounds like he has not been flaring since then.  OK to write a note stating that.    He needs rov in July or August for routing follow up

## 2013-07-06 NOTE — Telephone Encounter (Signed)
Can we write a letter stating that he has been doing well?

## 2013-07-06 NOTE — Telephone Encounter (Signed)
Letter at the front desk for pt pick up and he will call back to schedule a follow up

## 2013-08-11 ENCOUNTER — Telehealth: Payer: Self-pay | Admitting: Gastroenterology

## 2013-08-11 MED ORDER — MESALAMINE 1.2 G PO TBEC: 4.8000 g | DELAYED_RELEASE_TABLET | Freq: Every day | ORAL | Status: DC

## 2013-08-11 NOTE — Telephone Encounter (Signed)
The pt was happy to try the last office note.  I have faxed a copy to [phone] as requested by pt.

## 2013-08-11 NOTE — Telephone Encounter (Signed)
Pt would like a detailed letter stating that his UC is under control and hasn't had any major issues since Oct 2014.  Also he needs lialda refilled  I have sent refill as requested and will send to Dr Ardis Hughs for review regarding the letter.

## 2013-08-11 NOTE — Telephone Encounter (Signed)
Can you send him a copy of my last office note, ask him if that will suffice.  I am happy to add an addendum as well stating that "he hasn't had any major problems since last ROV" if he wants.

## 2013-10-12 ENCOUNTER — Encounter: Payer: Self-pay | Admitting: Gastroenterology

## 2013-10-12 ENCOUNTER — Ambulatory Visit (INDEPENDENT_AMBULATORY_CARE_PROVIDER_SITE_OTHER): Payer: BC Managed Care – PPO | Admitting: Gastroenterology

## 2013-10-12 ENCOUNTER — Other Ambulatory Visit (INDEPENDENT_AMBULATORY_CARE_PROVIDER_SITE_OTHER): Payer: BC Managed Care – PPO

## 2013-10-12 VITALS — BP 130/84 | HR 76 | Ht 73.0 in | Wt 178.0 lb

## 2013-10-12 DIAGNOSIS — K5289 Other specified noninfective gastroenteritis and colitis: Secondary | ICD-10-CM

## 2013-10-12 DIAGNOSIS — K529 Noninfective gastroenteritis and colitis, unspecified: Secondary | ICD-10-CM

## 2013-10-12 LAB — CBC WITH DIFFERENTIAL/PLATELET
Basophils Absolute: 0 10*3/uL (ref 0.0–0.1)
Basophils Relative: 0.4 % (ref 0.0–3.0)
Eosinophils Absolute: 0.1 10*3/uL (ref 0.0–0.7)
Eosinophils Relative: 2.2 % (ref 0.0–5.0)
HCT: 38.8 % — ABNORMAL LOW (ref 39.0–52.0)
Hemoglobin: 12.9 g/dL — ABNORMAL LOW (ref 13.0–17.0)
Lymphocytes Relative: 26.6 % (ref 12.0–46.0)
Lymphs Abs: 1.6 10*3/uL (ref 0.7–4.0)
MCHC: 33.3 g/dL (ref 30.0–36.0)
MCV: 84.7 fl (ref 78.0–100.0)
Monocytes Absolute: 0.6 10*3/uL (ref 0.1–1.0)
Monocytes Relative: 9.5 % (ref 3.0–12.0)
Neutro Abs: 3.8 10*3/uL (ref 1.4–7.7)
Neutrophils Relative %: 61.3 % (ref 43.0–77.0)
Platelets: 246 10*3/uL (ref 150.0–400.0)
RBC: 4.57 Mil/uL (ref 4.22–5.81)
RDW: 14.7 % (ref 11.5–15.5)
WBC: 6.1 10*3/uL (ref 4.0–10.5)

## 2013-10-12 LAB — COMPREHENSIVE METABOLIC PANEL
ALT: 18 U/L (ref 0–53)
AST: 24 U/L (ref 0–37)
Albumin: 4.2 g/dL (ref 3.5–5.2)
Alkaline Phosphatase: 59 U/L (ref 39–117)
BUN: 16 mg/dL (ref 6–23)
CO2: 30 mEq/L (ref 19–32)
Calcium: 9.5 mg/dL (ref 8.4–10.5)
Chloride: 101 mEq/L (ref 96–112)
Creatinine, Ser: 1 mg/dL (ref 0.4–1.5)
GFR: 91.61 mL/min (ref >60.00)
Glucose, Bld: 192 mg/dL — ABNORMAL HIGH (ref 70–99)
Potassium: 4.2 mEq/L (ref 3.5–5.1)
Sodium: 137 mEq/L (ref 135–145)
Total Bilirubin: 0.5 mg/dL (ref 0.2–1.2)
Total Protein: 7.6 g/dL (ref 6.0–8.3)

## 2013-10-12 NOTE — Progress Notes (Signed)
Review of pertinent gastrointestinal problems:   1. Left sided UC: Colonoscopy Dr. Penelope Coop 2006 up to 30cm. Mesalamine products did not seem to help very well. Significant flare summer 2009 steroids started. Had extreme hyperglycemia on steroids requiring insulin. Repeat colonoscopy December, 2009 showed inflammation moderate To 70 cm. He was started on sulfasalazine. Had drug rash, likely pneumonitis, LFT abnormalities ( transaminases up to almost 1000, bili up to 6). Holding his medicines resulted in complete resolution of abnormal labs checked serially. TPMT testing January, 2010 showed normal enzyme activity. February, 2010 tapered off all medicines and feels very well. May, 2010: flaring of symptoms for the past month, He went to see a [place] herbalist, did not start the oral mesalamine that I recommended, restarted prednisone. September, 2010 self-medicating with prednisone however tapering off. January, 2011, : doing well off traditional IBD medicines, taking Chinese Herb. 05/2011 severe flare, (abd pain, weight loss, bloody diarrhea Hb 6s), required admission, iv steroids, blood transfusion. October 2014: flare of symptoms (had stopped mesalamine 4 months prior); 01/2013 flare improved with prednisone and restarting lialda 2. swollen, somewhat thrombosed external hemorrhoid July 2013  HPI: This is a very pleasant 38 year old male whom I last saw January 2015. Since then he has continued to do very well with no significant colitis symptoms. He is taking full-strength mesalamine (lialda 4.8 g daily).  On that regimen he has 2-3 soft usually well formed bowel movements that are nonbloody. No significant abdominal pains. He is really doing well   Past Medical History  Diagnosis Date  . Colitis   . Steroid-induced hyperglycemia     Past Surgical History  Procedure Laterality Date  . Left thumb surgery    . Hernia repair  2010    left  . Wisdom tooth extraction  11/2010    Current Outpatient  Prescriptions  Medication Sig Dispense Refill  . ferrous sulfate 325 (65 FE) MG tablet Take 325 mg by mouth daily with breakfast.      . insulin aspart (NOVOLOG) 100 UNIT/ML injection sliding scale: CBG 70-120: 0 u; CBG 121-150: 3 u; CBG 151-200: 4 u; CBG 201-250: 7 u; CBG 251-300: 11 u;CBG 301-350: 15 u; CBG 351-400: 20 u; CBG > 400: call MD      . insulin glargine (LANTUS) 100 UNIT/ML injection Inject 20 Units into the skin every morning.      . mesalamine (LIALDA) 1.2 G EC tablet Take 4 tablets (4.8 g total) by mouth daily with breakfast.  120 tablet  6  . Multiple Vitamin (MULTIVITAMIN) tablet Take 1 tablet by mouth daily.      . ONE TOUCH ULTRA TEST test strip USE TO TEST BLOOD SUGAR 4 TIMES A DAY BEFORE MEALS AND AT BEDTIME  100 each  PRN  . ONE TOUCH ULTRA TEST test strip USE TO TEST BLOOD SUGAR 4 TIMES A DAY BEFORE MEALS AND AT BEDTIME  100 each  PRN   No current facility-administered medications for this visit.    Allergies as of 10/12/2013 - Review Complete 10/12/2013  Allergen Reaction Noted  . Moxifloxacin Other (See Comments)   . Sulfonamide derivatives Other (See Comments)     History reviewed. No pertinent family history.  History   Social History  . Marital Status: Married    Spouse Name: N/A    Number of Children: 0  . 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 Smoking status: Former Smoker  Types: Cigarettes    Quit date: 07/01/2004  . Smokeless tobacco: Never Used  . Alcohol Use: Yes     Comment: social  . Drug Use: No  . Sexual Activity: No   Other Topics Concern  . Not on file   Social History Narrative  . No narrative on file      Physical Exam: BP 130/84  Pulse 76  Ht 6' 1"  (1.854 m)  Wt 178 lb (80.74 kg)  BMI 23.49 kg/m2 Constitutional: generally well-appearing Psychiatric: alert and oriented x3 Abdomen: soft, nontender, nondistended, no obvious ascites, no peritoneal signs, normal  bowel sounds     Assessment and plan:  38 year old man with left-sided ulcerative colitis under complete clinical remission currently on full-strength daily mesalamine. He'll continue this regimen indefinitely. He will get a  basic set of labs including CBC, complete metabolic profile. He'll return to see me in 6 months and sooner if needed.

## 2014-04-27 ENCOUNTER — Other Ambulatory Visit: Payer: Self-pay | Admitting: Gastroenterology

## 2014-04-27 NOTE — Telephone Encounter (Signed)
Spoke with the patient. He will come by to pick up a discount card that works with [employer] and cash-paying patients. Card at the front desk.

## 2014-05-27 ENCOUNTER — Other Ambulatory Visit: Payer: Self-pay | Admitting: Hematology & Oncology

## 2014-06-20 ENCOUNTER — Telehealth: Payer: Self-pay | Admitting: Gastroenterology

## 2014-06-20 MED ORDER — PREDNISONE 10 MG PO TABS: 20.0000 mg | ORAL_TABLET | Freq: Two times a day (BID) | ORAL | Status: DC

## 2014-06-20 NOTE — Telephone Encounter (Signed)
Pt aware and med sent  

## 2014-06-20 NOTE — Telephone Encounter (Signed)
Yes, sounds like a colitis flare;  Please prescribe prednisone 10m pill, take two pills twice daily. Disp 120 pill, 1 refill.  No tapering for at least 2 weeks, I want him to call the office in 2 weeks.  Would prefer rov as soon as he can as well.

## 2014-06-20 NOTE — Telephone Encounter (Signed)
A user error has taken place.

## 2014-06-20 NOTE — Telephone Encounter (Signed)
Pt has no insurance at this time but has had rectal bleeding and urgency.  He will come in for a visit but has to wait for his insurance to go back in effect.  He is taking lialda 4 per day.  Would like to know if he should have a short course of prednisone before his symptoms worsen.  He states he will make an appt as soon as he can.

## 2014-06-24 ENCOUNTER — Telehealth: Payer: Self-pay | Admitting: Gastroenterology

## 2014-06-24 MED ORDER — MESALAMINE 1.2 G PO TBEC: 4.8000 g | DELAYED_RELEASE_TABLET | Freq: Every day | ORAL | Status: DC

## 2014-06-24 NOTE — Telephone Encounter (Signed)
Pt has been scheduled for ROV and lialda refill sent.  He will call in 2 weeks to update on condition

## 2014-07-11 ENCOUNTER — Telehealth: Payer: Self-pay | Admitting: Gastroenterology

## 2014-07-11 NOTE — Telephone Encounter (Signed)
Yes, by 67m per week.

## 2014-07-11 NOTE — Telephone Encounter (Signed)
Pt has been notified that Dr Ardis Hughs is off this week.  I will send to Dr Ardis Hughs for review and call back on Monday.  Dr Ardis Hughs please review, pt would like to decrease prednisone

## 2014-07-12 NOTE — Telephone Encounter (Signed)
Pt has been notified to decrease prednisone by 5 mg per week

## 2014-07-21 ENCOUNTER — Other Ambulatory Visit: Payer: Self-pay | Admitting: Gastroenterology

## 2014-08-30 ENCOUNTER — Ambulatory Visit (INDEPENDENT_AMBULATORY_CARE_PROVIDER_SITE_OTHER): Payer: BLUE CROSS/BLUE SHIELD | Admitting: Gastroenterology

## 2014-08-30 ENCOUNTER — Encounter: Payer: Self-pay | Admitting: Gastroenterology

## 2014-08-30 VITALS — BP 120/80 | HR 100 | Ht 73.0 in | Wt 175.2 lb

## 2014-08-30 DIAGNOSIS — K51919 Ulcerative colitis, unspecified with unspecified complications: Secondary | ICD-10-CM

## 2014-08-30 NOTE — Patient Instructions (Signed)
[facility] website, look up immunomodulators (azathiaprine).  Call if you are interested in hearing more. Continue decreasing prednisone by 51m per week. Please return to see Dr. JArdis Hughsin 2-3 months.

## 2014-08-30 NOTE — Progress Notes (Signed)
Review of pertinent gastrointestinal problems:  1. Left sided UC: Colonoscopy Dr. Penelope Coop 2006 up to 30cm. Mesalamine products did not seem to help very well. Significant flare summer 2009 steroids started. Had extreme hyperglycemia on steroids requiring insulin. Repeat colonoscopy December, 2009 showed inflammation moderate To 70 cm. He was started on sulfasalazine. Had drug rash, likely pneumonitis, LFT abnormalities ( transaminases up to almost 1000, bili up to 6). Holding his medicines resulted in complete resolution of abnormal labs checked serially. TPMT testing January, 2010 showed normal enzyme activity. February, 2010 tapered off all medicines and feels very well. May, 2010: flaring of symptoms for the past month, He went to see a [place] herbalist, did not start the oral mesalamine that I recommended, restarted prednisone. September, 2010 self-medicating with prednisone however tapering off. January, 2011, : doing well off traditional IBD medicines, taking Chinese Herb. 05/2011 severe flare, (abd pain, weight loss, bloody diarrhea Hb 6s), required admission, iv steroids, blood transfusion. October 2014: flare of symptoms (had stopped mesalamine 4 months prior); 01/2013 flare improved with prednisone and restarting lialda. Flared 06/2014, prednisone 79m BID 2. swollen, somewhat thrombosed external hemorrhoid July 2013  HPI: This is a   very pleasant 39year old man whom I last saw several months ago.  Chief complaint is colitis flare  He called 2 months ago with diarrhea, urgency, bleeding (3-5 times in AM).  Called in 266mbid.  Symptoms improved quickly.  Currently on 10 bid. 2 in AM, non bloody, no urgent. Solid most of the time.     Past Medical History  Diagnosis Date  . Colitis   . Steroid-induced hyperglycemia     Past Surgical History  Procedure Laterality Date  . Left thumb surgery    . Hernia repair  2010    left  . Wisdom tooth extraction  11/2010    Current Outpatient  Prescriptions  Medication Sig Dispense Refill  . ferrous sulfate 325 (65 FE) MG tablet Take 325 mg by mouth daily with breakfast.    . insulin aspart (NOVOLOG) 100 UNIT/ML injection sliding scale: CBG 70-120: 0 u; CBG 121-150: 3 u; CBG 151-200: 4 u; CBG 201-250: 7 u; CBG 251-300: 11 u;CBG 301-350: 15 u; CBG 351-400: 20 u; CBG > 400: call MD    . insulin glargine (LANTUS) 100 UNIT/ML injection Inject 20 Units into the skin every morning.    . mesalamine (LIALDA) 1.2 G EC tablet Take 4 tablets (4.8 g total) by mouth daily with breakfast. 120 tablet 6  . Multiple Vitamin (MULTIVITAMIN) tablet Take 1 tablet by mouth daily.    . ONE TOUCH ULTRA TEST test strip USE TO TEST BLOOD SUGAR 4 TIMES A DAY BEFORE MEALS AND AT BEDTIME 100 each PRN  . predniSONE (DELTASONE) 10 MG tablet TAKE 2 TABLETS (20 MG TOTAL) BY MOUTH 2 (TWO) TIMES DAILY WITH A MEAL. 120 tablet 0   No current facility-administered medications for this visit.    Allergies as of 08/30/2014 - Review Complete 08/30/2014  Allergen Reaction Noted  . Moxifloxacin Other (See Comments)   . Sulfonamide derivatives Other (See Comments)     History reviewed. No pertinent family history.  Social History   Social History  . Marital Status: Married    Spouse Name: N/A  . Number of Children: 0  . 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 Smoking status: Former Smoker    Types: Cigarettes    Quit  date: 07/01/2004  . Smokeless tobacco: Never Used  . Alcohol Use: Yes     Comment: social  . Drug Use: No  . Sexual Activity: No   Other Topics Concern  . Not on file   Social History Narrative     Physical Exam: BP 120/80 mmHg  Pulse 100  Ht 6' 1"  (1.854 m)  Wt 175 lb 3.2 oz (79.47 kg)  BMI 23.12 kg/m2 Constitutional: generally well-appearing, except for cushingoid face Psychiatric: alert and oriented x3 Abdomen: soft, nontender, nondistended, no obvious ascites, no peritoneal  signs, normal bowel sounds   Assessment and plan: 39 y.o. male with ulcerative colitis  Again he has responded to read zone. We discussed that the mesalamine does not seem to be holding him in remission and that has required steroids several times.  I recommended that he consider starting immunomodulators with azathioprine. He is reluctant but he will check out the Crohn's and colitis [place] websites to try to learn more about them. I told him I do think that in the long run it would be healthier for him overall if he is on azathioprine rather then mesalamine and continuing to require high-dose sterile for several weeks every year or 2.  He will return to see me in 2-3 months and sooner if needed.    Owens Loffler, MD [facility] 08/30/2014, 3:53 PM

## 2014-09-09 ENCOUNTER — Other Ambulatory Visit: Payer: Self-pay | Admitting: Gastroenterology

## 2014-09-09 MED ORDER — PREDNISONE 10 MG PO TABS: ORAL_TABLET | ORAL | Status: DC

## 2014-09-09 NOTE — Telephone Encounter (Signed)
Kiana from [place] calling in on this. [phone].

## 2014-09-09 NOTE — Telephone Encounter (Signed)
Prescription send to [facility].

## 2014-11-01 ENCOUNTER — Encounter (INDEPENDENT_AMBULATORY_CARE_PROVIDER_SITE_OTHER): Payer: BLUE CROSS/BLUE SHIELD | Admitting: Gastroenterology

## 2014-11-01 ENCOUNTER — Encounter: Payer: Self-pay | Admitting: Gastroenterology

## 2014-11-02 ENCOUNTER — Telehealth: Payer: Self-pay | Admitting: Gastroenterology

## 2014-11-02 NOTE — Telephone Encounter (Signed)
I spoke with Kenneth [name] after I missed his appt yesterday afternoon. He's feeling well on lialda 4 pills once daily, having 2 solid, non-bloody BMs daily.  Currently not interested in changing therapy but agrees that if he needs prednisone for continued flares then he will likely agree to immunomodulators or biologics.  [name], Can you please cancel his next week appt and reschedule for rov in 6 months.

## 2014-11-03 NOTE — Telephone Encounter (Signed)
Pt appt has been cancelled and recall for 6 months in EPIC

## 2014-11-08 ENCOUNTER — Ambulatory Visit: Payer: BLUE CROSS/BLUE SHIELD | Admitting: Gastroenterology

## 2015-01-20 ENCOUNTER — Telehealth: Payer: Self-pay | Admitting: Gastroenterology

## 2015-01-20 ENCOUNTER — Other Ambulatory Visit: Payer: Self-pay | Admitting: Gastroenterology

## 2015-01-20 MED FILL — NovoLOG 100 UNIT/ML SOLN: 100 | 30 days supply | Qty: 10 | Fill #4

## 2015-01-20 NOTE — Telephone Encounter (Signed)
See alternate note  

## 2015-01-20 NOTE — Telephone Encounter (Signed)
3 boxes of samples left at the front desk,  Also prior auth received and completed.  Will wait communication from the insurance company

## 2015-01-24 ENCOUNTER — Telehealth: Payer: Self-pay

## 2015-01-24 NOTE — Telephone Encounter (Signed)
Dr Ardis Hughs the pt insurance has denied his lialda and state he must try and fail delzicol first.  I can leave him samples to try for 30 days and if he does not respond well they will then approve lialda.  Is it ok to leave the delzicol samples and if so how do you want him to take it?

## 2015-01-25 NOTE — Telephone Encounter (Signed)
Dr Ardis Hughs the pt is concerned about taking the Delzicol because of the problems he had several years ago when he was admitted to the ICU.  He doesn't want to take any med that is going to cause him to be sick again, please advise

## 2015-01-25 NOTE — Telephone Encounter (Signed)
i remember it well.  He had a reaction to sulfasalazine.  Delzicol is a mesalamine product, just like lialda that he has been taking recently.  Delzicol is NOT suflasalazine, I expect it to be just as safe for him as lialda has been.

## 2015-01-25 NOTE — Telephone Encounter (Signed)
438m pills, he should take 2 pills tid.  I think a longer trial with the delzicol is better than just one month.  Can you give him a script with 3 refills.  He needs rov in 2 months if not already scheduled.  Thanks

## 2015-01-26 NOTE — Telephone Encounter (Signed)
Pt aware and states he will call when he has finished taking the samples of the Delzicol.

## 2015-02-01 ENCOUNTER — Telehealth: Payer: Self-pay | Admitting: Gastroenterology

## 2015-02-02 NOTE — Telephone Encounter (Signed)
Please call his ins company,  delzichol has very clear warnign on website about sulfasalazine reactions which he has clearly had.  Want him to be on his lialda instead.

## 2015-02-02 NOTE — Telephone Encounter (Signed)
Pt transferred to Dr Ardis Hughs to discuss

## 2015-02-02 NOTE — Telephone Encounter (Signed)
Dr Ardis Hughs spoke to the insurance company ([employer]), lialda has been covered and the pt notified to call his pharmacy in 2-3 hours.

## 2015-02-08 MED FILL — CONTOUR NEXT STRIPS: 90 days supply | Qty: 400 | Fill #0

## 2015-02-08 MED FILL — ULTICARE SYR 0.3 ML 30GX5/1: 30G X 5/16" | 30 days supply | Qty: 120 | Fill #1

## 2015-02-08 MED FILL — BD PEN NDL NANO 32GX5/32: 32G X 4 MM | 33 days supply | Qty: 100 | Fill #0

## 2015-02-08 MED FILL — MICROLET LANCETS: 90 days supply | Qty: 400 | Fill #0

## 2015-02-08 MED FILL — LIALDA 1.2 GM TABLET SA: 1.2 | 30 days supply | Qty: 120 | Fill #0

## 2015-02-09 MED FILL — LANTUS 100 UNITS/ML VIAL: 100 | 30 days supply | Qty: 10 | Fill #2

## 2015-02-09 MED FILL — CONTOUR NEXT METER: W/DEVICE | 1 days supply | Qty: 1 | Fill #0

## 2015-03-07 ENCOUNTER — Encounter: Payer: Self-pay | Admitting: Gastroenterology

## 2015-03-10 MED FILL — LIALDA 1.2 GM TABLET SA: 1.2 | 30 days supply | Qty: 120 | Fill #1

## 2015-03-15 MED FILL — NOVOLOG FLEXPEN SYRINGE: 100 | 84 days supply | Qty: 15 | Fill #0

## 2015-04-12 MED FILL — LIALDA 1.2 GM TABLET SA: 1.2 | 30 days supply | Qty: 120 | Fill #2

## 2015-04-21 MED FILL — TOUJEO SOLOSTAR 300 UNITS/M: 300 | 90 days supply | Qty: 5 | Fill #0

## 2015-05-04 MED FILL — BD PEN NDL NANO 32GX5/32: 32G X 4 MM | 33 days supply | Qty: 100 | Fill #1

## 2015-05-12 MED FILL — LIALDA 1.2 GM TABLET SA: 1.2 | 30 days supply | Qty: 120 | Fill #3

## 2015-05-18 MED FILL — NOVOLOG FLEXPEN SYRINGE: 100 | 72 days supply | Qty: 15 | Fill #0

## 2015-05-18 MED FILL — CONTOUR NEXT STRIPS: 90 days supply | Qty: 400 | Fill #1

## 2015-05-26 ENCOUNTER — Encounter: Payer: Self-pay | Admitting: Gastroenterology

## 2015-06-02 MED FILL — BD PEN NDL NANO 32GX5/32: 32G X 4 MM | 34 days supply | Qty: 100 | Fill #0

## 2015-06-09 MED FILL — LIALDA 1.2 GM TABLET SA: 1.2 | 30 days supply | Qty: 120 | Fill #4

## 2015-06-28 MED FILL — TOUJEO SOLOSTAR 300 UNITS/M: 300 | 68 days supply | Qty: 5 | Fill #0

## 2015-07-07 MED FILL — BD PEN NDL NANO 32GX5/32: 32G X 4 MM | 34 days supply | Qty: 100 | Fill #1

## 2015-07-11 MED FILL — LIALDA 1.2 GM TABLET SA: 1.2 | 30 days supply | Qty: 120 | Fill #5

## 2015-07-19 MED FILL — NOVOLOG FLEXPEN SYRINGE: 100 | 34 days supply | Qty: 15 | Fill #0

## 2015-08-02 MED FILL — BD PEN NDL NANO 32GX5/32: 32G X 4 MM | 30 days supply | Qty: 100 | Fill #0

## 2015-08-04 MED FILL — TOUJEO SOLOSTAR 300 UNITS/M: 300 | 33 days supply | Qty: 5 | Fill #0

## 2015-08-08 ENCOUNTER — Ambulatory Visit: Payer: BLUE CROSS/BLUE SHIELD | Admitting: Gastroenterology

## 2015-08-10 MED FILL — LIALDA 1.2 GM TABLET SA: 1.2 | 30 days supply | Qty: 120 | Fill #6

## 2015-08-31 MED FILL — BD PEN NDL NANO 32GX5/32: 32G X 4 MM | 30 days supply | Qty: 100 | Fill #1

## 2015-09-08 ENCOUNTER — Other Ambulatory Visit: Payer: Self-pay | Admitting: Gastroenterology

## 2015-09-08 MED FILL — LIALDA 1.2 GM TABLET SA: 1.2 | 30 days supply | Qty: 120 | Fill #0

## 2015-09-13 MED FILL — NOVOLOG FLEXPEN SYRINGE: 100 | 34 days supply | Qty: 15 | Fill #1

## 2015-09-29 MED FILL — BD PEN NDL NANO 32GX5/32: 32G X 4 MM | 30 days supply | Qty: 100 | Fill #2

## 2015-09-29 MED FILL — BD PEN NDL NANO 32GX5/32": 32G X 4 MM | 30 days supply | Qty: 100 | Fill #2

## 2015-10-03 MED FILL — TOUJEO SOLOSTAR 300 UNITS/M: 300 | 33 days supply | Qty: 5 | Fill #1

## 2015-10-16 ENCOUNTER — Encounter: Payer: Self-pay | Admitting: Gastroenterology

## 2015-10-16 ENCOUNTER — Encounter (INDEPENDENT_AMBULATORY_CARE_PROVIDER_SITE_OTHER): Payer: Self-pay

## 2015-10-16 ENCOUNTER — Other Ambulatory Visit (INDEPENDENT_AMBULATORY_CARE_PROVIDER_SITE_OTHER): Payer: BLUE CROSS/BLUE SHIELD

## 2015-10-16 ENCOUNTER — Ambulatory Visit (INDEPENDENT_AMBULATORY_CARE_PROVIDER_SITE_OTHER): Payer: BLUE CROSS/BLUE SHIELD | Admitting: Gastroenterology

## 2015-10-16 VITALS — BP 116/72 | HR 76 | Ht 73.0 in | Wt 178.8 lb

## 2015-10-16 DIAGNOSIS — K51919 Ulcerative colitis, unspecified with unspecified complications: Secondary | ICD-10-CM

## 2015-10-16 LAB — CBC WITH DIFFERENTIAL/PLATELET
Basophils Absolute: 0 10*3/uL (ref 0.0–0.1)
Basophils Relative: 0.4 % (ref 0.0–3.0)
Eosinophils Absolute: 0.5 10*3/uL (ref 0.0–0.7)
Eosinophils Relative: 7 % — ABNORMAL HIGH (ref 0.0–5.0)
HCT: 39.2 % (ref 39.0–52.0)
Hemoglobin: 13.4 g/dL (ref 13.0–17.0)
Lymphocytes Relative: 29.7 % (ref 12.0–46.0)
Lymphs Abs: 1.9 10*3/uL (ref 0.7–4.0)
MCHC: 34.3 g/dL (ref 30.0–36.0)
MCV: 91.8 fl (ref 78.0–100.0)
Monocytes Absolute: 0.6 10*3/uL (ref 0.1–1.0)
Monocytes Relative: 8.5 % (ref 3.0–12.0)
Neutro Abs: 3.5 10*3/uL (ref 1.4–7.7)
Neutrophils Relative %: 54.4 % (ref 43.0–77.0)
Platelets: 245 10*3/uL (ref 150.0–400.0)
RBC: 4.26 Mil/uL (ref 4.22–5.81)
RDW: 13.4 % (ref 11.5–15.5)
WBC: 6.5 10*3/uL (ref 4.0–10.5)

## 2015-10-16 LAB — COMPREHENSIVE METABOLIC PANEL
ALT: 15 U/L (ref 0–53)
AST: 18 U/L (ref 0–37)
Albumin: 4 g/dL (ref 3.5–5.2)
Alkaline Phosphatase: 51 U/L (ref 39–117)
BUN: 10 mg/dL (ref 6–23)
CO2: 29 mEq/L (ref 19–32)
Calcium: 9.1 mg/dL (ref 8.4–10.5)
Chloride: 104 mEq/L (ref 96–112)
Creatinine, Ser: 0.84 mg/dL (ref 0.40–1.50)
GFR: 108.27 mL/min (ref >60.00)
Glucose, Bld: 94 mg/dL (ref 70–99)
Potassium: 3.5 mEq/L (ref 3.5–5.1)
Sodium: 141 mEq/L (ref 135–145)
Total Bilirubin: 0.5 mg/dL (ref 0.2–1.2)
Total Protein: 7.3 g/dL (ref 6.0–8.3)

## 2015-10-16 MED FILL — LIALDA 1.2 GM TABLET SA: 1.2 | 30 days supply | Qty: 120 | Fill #1

## 2015-10-16 NOTE — Patient Instructions (Signed)
Recall colonoscopy December 2019 for long standing UC. Refill lialda as needed. Please return to see Dr. Ardis Hughs in 12 months. You will have labs checked today in the basement lab.  Please head down after you check out with the front desk  (cbc, cmet).

## 2015-10-16 NOTE — Progress Notes (Signed)
Review of pertinent gastrointestinal problems:  1. Left sided UC: Colonoscopy Dr. Penelope Coop 2006 up to 30cm. Mesalamine products did not seem to help very well. Significant flare summer 2009 steroids started. Had extreme hyperglycemia on steroids requiring insulin. Repeat colonoscopy December, 2009 showed inflammation moderate To 70 cm. He was started on sulfasalazine. Had drug rash, likely pneumonitis, LFT abnormalities ( transaminases up to almost 1000, bili up to 6). Holding his medicines resulted in complete resolution of abnormal labs checked serially. TPMT testing January, 2010 showed normal enzyme activity. February, 2010 tapered off all medicines and feels very well. May, 2010: flaring of symptoms for the past month, He went to see a [place] herbalist, did not start the oral mesalamine that I recommended, restarted prednisone. September, 2010 self-medicating with prednisone however tapering off. January, 2011, : doing well off traditional IBD medicines, taking Chinese Herb. 05/2011 severe flare, (abd pain, weight loss, bloody diarrhea Hb 6s), required admission, iv steroids, blood transfusion. October 2014: flare of symptoms (had stopped mesalamine 4 months prior); 01/2013 flare improved with prednisone and restarting lialda. Flared 06/2014, prednisone 33m BID 2. swollen, somewhat thrombosed external hemorrhoid July 2013   HPI: This is a very pleasant 40year old man whom I saw last about a year ago.  Chief complaint is left sided ulcerative colitis  lialda 4 pills once daily usually.   Has not been on prednisone in about 9 months.  Bowels have been fine, never bloody.  Periodically mucousy.  No abd pains.  He has been gaining weight lately  ROS: complete GI ROS as described in HPI.  Constitutional:  No unintentional weight loss   Past Medical History:  Diagnosis Date  . Colitis   . [place]   . Steroid-induced hyperglycemia     Past Surgical History:  Procedure  Laterality Date  . HERNIA REPAIR  2010   left  . left thumb surgery    . WISDOM TOOTH EXTRACTION  11/2010    Current Outpatient Prescriptions  Medication Sig Dispense Refill  . ferrous sulfate 325 (65 FE) MG tablet Take 325 mg by mouth daily with breakfast.    . insulin aspart (NOVOLOG) 100 UNIT/ML injection sliding scale: CBG 70-120: 0 u; CBG 121-150: 3 u; CBG 151-200: 4 u; CBG 201-250: 7 u; CBG 251-300: 11 u;CBG 301-350: 15 u; CBG 351-400: 20 u; CBG > 400: call MD    . insulin glargine (LANTUS) 100 UNIT/ML injection Inject 20 Units into the skin every morning.    .Marland KitchenLIALDA 1.2 g EC tablet TAKE 4 TABLETS BY MOUTH DAILY WITH BREAKFAST. 120 tablet 6  . Multiple Vitamin (MULTIVITAMIN) tablet Take 1 tablet by mouth daily.    . ONE TOUCH ULTRA TEST test strip USE TO TEST BLOOD SUGAR 4 TIMES A DAY BEFORE MEALS AND AT BEDTIME 100 each PRN   No current facility-administered medications for this visit.     Allergies as of 10/16/2015 - Review Complete 10/16/2015  Allergen Reaction Noted  . Moxifloxacin Other (See Comments)   . Sulfonamide derivatives Other (See Comments)     Family History  Problem Relation Age of Onset  . Heart disease Paternal Grandfather     Social History   Social History  . Marital status: Married    Spouse name: N/A  . Number of children: 0  . Years of education: N/A   Occupational History  . artist    Social History Main Topics  . Smoking status: Former Smoker    Types: Cigarettes  Quit date: 07/01/2004  . Smokeless tobacco: Never Used  . Alcohol use Yes     Comment: social  . Drug use: No  . Sexual activity: No   Other Topics Concern  . Not on file   Social History Narrative  . No narrative on file     Physical Exam: BP 116/72 (BP Location: Left Arm, Patient Position: Sitting, Cuff Size: Normal)   Pulse 76   Ht 6' 1"  (1.854 m)   Wt 178 lb 12.8 oz (81.1 kg)   BMI 23.59 kg/m  Constitutional: generally well-appearing Psychiatric: alert  and oriented x3 Abdomen: soft, nontender, nondistended, no obvious ascites, no peritoneal signs, normal bowel sounds No peripheral edema noted in lower extremities  Assessment and plan: 40 y.o. male with Left sided UC, long-standing  His colitis seems under good control on full-strength mesalamine orally. He is going to get a basic set of labs today including CBC, basic metabolic profile.   he'll return to see me in one year. I would like to repeat colonoscopy in about 2 years which will be 10 years from his last one and 13 or 14 years after his original diagnosis.  Owens Loffler, MD [facility] 10/16/2015, 3:41 PM

## 2015-10-17 ENCOUNTER — Telehealth: Payer: Self-pay | Admitting: Gastroenterology

## 2015-10-17 NOTE — Telephone Encounter (Signed)
Labs were released into My Chart as requested

## 2015-11-02 MED FILL — BD PEN NDL NANO 32GX5/32: 32G X 4 MM | 30 days supply | Qty: 100 | Fill #3

## 2015-11-02 MED FILL — BD PEN NDL NANO 32GX5/32": 32G X 4 MM | 30 days supply | Qty: 100 | Fill #3

## 2015-11-08 MED FILL — CONTOUR NEXT STRIPS: 90 days supply | Qty: 400 | Fill #0

## 2015-11-17 MED FILL — MESALAMINE DR 1.2G TABLET: 1.2 | 30 days supply | Qty: 120 | Fill #2

## 2015-11-22 MED FILL — NOVOLOG FLEXPEN SYRINGE: 100 | 34 days supply | Qty: 15 | Fill #2

## 2015-12-01 MED FILL — BD PEN NDL NANO 32GX5/32": 32G X 4 MM | 30 days supply | Qty: 100 | Fill #0

## 2015-12-01 MED FILL — BD PEN NDL NANO 32GX5/32: 32G X 4 MM | 30 days supply | Qty: 100 | Fill #0

## 2015-12-04 MED FILL — TOUJEO SOLOSTAR 300 UNITS/M: 300 | 33 days supply | Qty: 5 | Fill #2

## 2015-12-19 MED FILL — MESALAMINE DR 1.2G TABLET: 1.2 | 30 days supply | Qty: 120 | Fill #3

## 2016-01-05 MED FILL — BD PEN NDL NANO 32GX5/32: 32G X 4 MM | 90 days supply | Qty: 300 | Fill #0

## 2016-01-05 MED FILL — BD PEN NDL NANO 32GX5/32": 32G X 4 MM | 90 days supply | Qty: 300 | Fill #0

## 2016-01-19 MED FILL — LIALDA 1.2 GM TABLET SA: 1.2 | 30 days supply | Qty: 120 | Fill #4

## 2016-01-26 MED FILL — HUMALOG 100 UNITS/ML KWIKPE: 100 | 33 days supply | Qty: 15 | Fill #0

## 2016-01-26 MED FILL — TOUJEO SOLOSTAR 300 UNITS/M: 300 | 33 days supply | Qty: 5 | Fill #3

## 2016-02-02 MED FILL — ULTICARE PEN NDL 4MM 32G: 32G X 4 MM | 90 days supply | Qty: 300 | Fill #1

## 2016-02-07 DIAGNOSIS — Z794 Long term (current) use of insulin: Secondary | ICD-10-CM | POA: Diagnosis not present

## 2016-02-07 DIAGNOSIS — E099 Drug or chemical induced diabetes mellitus without complications: Secondary | ICD-10-CM | POA: Diagnosis not present

## 2016-02-20 DIAGNOSIS — H52223 Regular astigmatism, bilateral: Secondary | ICD-10-CM | POA: Diagnosis not present

## 2016-02-20 DIAGNOSIS — H5213 Myopia, bilateral: Secondary | ICD-10-CM | POA: Diagnosis not present

## 2016-02-20 DIAGNOSIS — E099 Drug or chemical induced diabetes mellitus without complications: Secondary | ICD-10-CM | POA: Diagnosis not present

## 2016-02-22 MED FILL — LIALDA 1.2 GM TABLET SA: 1.2 | 30 days supply | Qty: 120 | Fill #5

## 2016-03-20 MED FILL — TOUJEO SOLOSTAR 300 UNITS/M: 300 | 33 days supply | Qty: 5 | Fill #4

## 2016-03-25 MED FILL — LIALDA 1.2 GM TABLET SA: 1.2 | 30 days supply | Qty: 120 | Fill #6

## 2016-04-01 MED FILL — FREESTYLE LITE METER: 30 days supply | Qty: 1 | Fill #0

## 2016-04-01 MED FILL — FREESTYLE LITE TEST STRIP: 90 days supply | Qty: 400 | Fill #0

## 2016-04-01 MED FILL — FREESTYLE LANCETS: 90 days supply | Qty: 400 | Fill #0

## 2016-04-02 MED FILL — HUMALOG 100 UNITS/ML KWIKPE: 100 | 33 days supply | Qty: 15 | Fill #1

## 2016-04-24 ENCOUNTER — Other Ambulatory Visit: Payer: Self-pay | Admitting: Gastroenterology

## 2016-04-24 MED FILL — LIALDA 1.2 GM TABLET SA: 1.2 | 30 days supply | Qty: 120 | Fill #0

## 2016-05-14 MED FILL — TOUJEO SOLOSTAR 300 UNITS/M: 300 | 33 days supply | Qty: 5 | Fill #5

## 2016-05-15 NOTE — Progress Notes (Signed)
This encounter was created in error - please disregard.

## 2016-05-23 MED FILL — LIALDA 1.2 GM TABLET SA: 1.2 | 30 days supply | Qty: 120 | Fill #1

## 2016-06-20 MED FILL — HUMALOG 100 UNITS/ML KWIKPE: 100 | 33 days supply | Qty: 15 | Fill #2

## 2016-06-28 MED FILL — LIALDA 1.2 GM TABLET SA: 1.2 | 30 days supply | Qty: 120 | Fill #2

## 2016-07-09 DIAGNOSIS — Z794 Long term (current) use of insulin: Secondary | ICD-10-CM | POA: Diagnosis not present

## 2016-07-09 DIAGNOSIS — E099 Drug or chemical induced diabetes mellitus without complications: Secondary | ICD-10-CM | POA: Diagnosis not present

## 2016-07-11 MED FILL — TOUJEO SOLOSTAR 300 UNITS/M: 300 | 33 days supply | Qty: 5 | Fill #6

## 2016-07-24 MED FILL — ULTICARE PEN NDL 4MM 32G: 32G X 4 MM | 90 days supply | Qty: 300 | Fill #2

## 2016-07-30 MED FILL — LIALDA 1.2 GM TABLET SA: 1.2 | 30 days supply | Qty: 120 | Fill #3

## 2016-07-31 MED FILL — FREESTYLE LITE TEST STRIP: 90 days supply | Qty: 400 | Fill #1

## 2016-09-03 MED FILL — LIALDA 1.2 GM TABLET SA: 1.2 | 30 days supply | Qty: 120 | Fill #4

## 2016-09-09 MED FILL — HUMALOG 100 UNITS/ML KWIKPE: 100 | 33 days supply | Qty: 15 | Fill #3

## 2016-09-09 MED FILL — TOUJEO SOLOSTAR 300 UNITS/M: 300 | 50 days supply | Qty: 5 | Fill #0

## 2016-10-07 MED FILL — LIALDA 1.2 GM TABLET SA: 1.2 | 30 days supply | Qty: 120 | Fill #5

## 2016-11-11 MED FILL — LIALDA 1.2 GM TABLET SA: 1.2 | 30 days supply | Qty: 120 | Fill #6

## 2016-11-19 MED FILL — ULTICARE PEN NDL 4MM 32G: 32G X 4 MM | 90 days supply | Qty: 300 | Fill #3

## 2016-11-26 MED FILL — HUMALOG 100 UNITS/ML KWIKPE: 100 | 55 days supply | Qty: 15 | Fill #0

## 2016-12-02 MED FILL — FREESTYLE LITE TEST STRIP: 90 days supply | Qty: 400 | Fill #2

## 2016-12-13 ENCOUNTER — Other Ambulatory Visit: Payer: Self-pay | Admitting: Gastroenterology

## 2016-12-13 MED FILL — LIALDA 1.2 GM TABLET SA: 1.2 | 30 days supply | Qty: 120 | Fill #0

## 2017-01-03 MED FILL — TOUJEO SOLOSTAR 300 UNITS/M: 300 | 50 days supply | Qty: 5 | Fill #1

## 2017-01-13 MED FILL — LIALDA 1.2 GM TABLET SA: 1.2 | 30 days supply | Qty: 120 | Fill #1

## 2017-01-14 HISTORY — PX: COLONOSCOPY: SHX174

## 2017-01-29 MED FILL — HUMALOG 100 UNITS/ML KWIKPE: 100 | 55 days supply | Qty: 15 | Fill #1

## 2017-02-17 DIAGNOSIS — E099 Drug or chemical induced diabetes mellitus without complications: Secondary | ICD-10-CM | POA: Diagnosis not present

## 2017-02-17 DIAGNOSIS — Z794 Long term (current) use of insulin: Secondary | ICD-10-CM | POA: Diagnosis not present

## 2017-02-18 MED FILL — LIALDA 1.2 GM TABLET SA: 1.2 | 30 days supply | Qty: 120 | Fill #2

## 2017-03-05 MED FILL — TOUJEO SOLOSTAR 300 UNITS/M: 300 | 50 days supply | Qty: 5 | Fill #2

## 2017-03-21 MED FILL — FREESTYLE LITE TEST STRIP: 90 days supply | Qty: 400 | Fill #3

## 2017-03-21 MED FILL — LIALDA 1.2 GM TABLET SA: 1.2 | 30 days supply | Qty: 120 | Fill #3

## 2017-03-28 MED FILL — ULTICARE PEN NDL 4MM 32G: 32G X 4 MM | 90 days supply | Qty: 300 | Fill #0

## 2017-04-08 MED FILL — HUMALOG 100 UNITS/ML KWIKPE: 100 | 55 days supply | Qty: 15 | Fill #2

## 2017-04-23 MED FILL — LIALDA 1.2 GM TABLET SA: 1.2 | 30 days supply | Qty: 120 | Fill #4

## 2017-04-28 DIAGNOSIS — H5213 Myopia, bilateral: Secondary | ICD-10-CM | POA: Diagnosis not present

## 2017-04-28 DIAGNOSIS — E099 Drug or chemical induced diabetes mellitus without complications: Secondary | ICD-10-CM | POA: Diagnosis not present

## 2017-05-06 MED FILL — TOUJEO SOLOSTAR 300 UNITS/M: 300 | 50 days supply | Qty: 5 | Fill #3

## 2017-05-23 DIAGNOSIS — E099 Drug or chemical induced diabetes mellitus without complications: Secondary | ICD-10-CM | POA: Diagnosis not present

## 2017-05-23 LAB — HEMOGLOBIN A1C: Hemoglobin A1C: 7.4

## 2017-05-23 LAB — LIPID PANEL
Cholesterol: 208 — AB (ref 0–200)
LDL Cholesterol: 131
Triglycerides: 76 (ref 40–160)

## 2017-05-26 DIAGNOSIS — E099 Drug or chemical induced diabetes mellitus without complications: Secondary | ICD-10-CM | POA: Diagnosis not present

## 2017-05-26 DIAGNOSIS — Z794 Long term (current) use of insulin: Secondary | ICD-10-CM | POA: Diagnosis not present

## 2017-05-26 LAB — MICROALBUMIN, URINE: Microalb, Ur: 29.7

## 2017-05-26 MED FILL — LIALDA 1.2 GM TABLET SA: 1.2 | 30 days supply | Qty: 120 | Fill #5

## 2017-06-02 ENCOUNTER — Encounter: Payer: Self-pay | Admitting: Family

## 2017-06-02 ENCOUNTER — Ambulatory Visit: Payer: 59 | Admitting: Family

## 2017-06-02 VITALS — BP 149/94 | HR 79 | Temp 98.7°F | Resp 16 | Ht 72.5 in | Wt 177.4 lb

## 2017-06-02 DIAGNOSIS — E119 Type 2 diabetes mellitus without complications: Secondary | ICD-10-CM

## 2017-06-02 DIAGNOSIS — K51819 Other ulcerative colitis with unspecified complications: Secondary | ICD-10-CM | POA: Diagnosis not present

## 2017-06-02 DIAGNOSIS — R030 Elevated blood-pressure reading, without diagnosis of hypertension: Secondary | ICD-10-CM

## 2017-06-02 DIAGNOSIS — Z794 Long term (current) use of insulin: Secondary | ICD-10-CM | POA: Diagnosis not present

## 2017-06-02 NOTE — Progress Notes (Signed)
Subjective:    Patient ID: Kenneth [name], male    DOB: 12/09/1975, 42 y.o.   MRN: [id number]  HPI  Patient is a 42 yr old male who presents today to establish care.   Pmhx is significant for:   Reports that he is followed by Endo (Dr. Buddy Duty). Reports last a1c was 6.4  DM2- initially steroid induced but he has been off of steroids x 2 years and is still on insulin. He is followed by Dr. Buddy Duty (endo). Reports last A1C was 6.4.  Ulcerative colitis- followed by GI (Dr. Ardis Hughs). Reports that this was diagnosed 12 years ago.  Reports that he is currently asymptomatic.   Review of Systems  Constitutional: Negative for unexpected weight change.  HENT: Negative for hearing loss and rhinorrhea.   Eyes: Negative for visual disturbance.  Respiratory: Negative for cough.   Cardiovascular: Negative for leg swelling.  Gastrointestinal:       No current diarrhea, no blood in stool  Genitourinary: Negative for dysuria, frequency and hematuria.  Musculoskeletal: Negative for arthralgias and myalgias.       Occasional low back pain  Skin: Negative for rash.  Neurological: Negative for headaches.  Hematological: Negative for adenopathy.  Psychiatric/Behavioral:       Denies depression/anxiety   Past Medical History:  Diagnosis Date  . Colitis   . Crohn's disease ([place])   . Steroid-induced hyperglycemia      Social History   Socioeconomic History  . Marital status: Married    Spouse name: Not on file  . Number of children: 0  . Years of education: Not on file  . Highest education level: Not on file  Occupational History  . Occupation: Training and development officer  Social Needs  . Financial resource strain: Not hard at all  . Food insecurity:    Worry: Never true    Inability: Never true  . Transportation needs:    Medical: No    Non-medical: No  Tobacco Use  . Smoking status: Former Smoker    Types: Cigarettes    Last attempt to quit: 07/01/2004    Years since quitting: 12.9  . Smokeless tobacco:  Never Used  Substance and Sexual Activity  . Alcohol use: Yes    Comment: social  . Drug use: No  . Sexual activity: Yes    Partners: Female  Lifestyle  . Physical activity:    Days per week: 2 days    Minutes per session: 50 min  . Stress: Only a little  Relationships  . Social connections:    Talks on phone: Once a week    Gets together: Not on file    Attends religious service: More than 4 times per year    Active member of club or organization: No    Attends meetings of clubs or organizations: Never    Relationship status: Married  . Intimate partner violence:    Fear of current or ex partner: No    Emotionally abused: No    Physically abused: No    Forced sexual activity: No  Other Topics Concern  . Not on file  Social History Narrative  . Not on file    Past Surgical History:  Procedure Laterality Date  . HERNIA REPAIR  2010   left  . left thumb surgery  2000  . WISDOM TOOTH EXTRACTION  11/2010    Family History  Problem Relation Age of Onset  . Heart disease Paternal Grandfather   . Heart attack Paternal Grandfather   .  GER disease Father   . Hyperlipidemia Maternal Grandmother   . Stroke Maternal Grandmother     Allergies  Allergen Reactions  . Moxifloxacin Other (See Comments)    Liver failure  . Sulfonamide Derivatives Other (See Comments)    Liver failure    Current Outpatient Medications on File Prior to Visit  Medication Sig Dispense Refill  . ferrous sulfate 325 (65 FE) MG tablet Take 325 mg by mouth daily with breakfast.    . insulin aspart (NOVOLOG) 100 UNIT/ML injection sliding scale: CBG 70-120: 0 u; CBG 121-150: 3 u; CBG 151-200: 4 u; CBG 201-250: 7 u; CBG 251-300: 11 u;CBG 301-350: 15 u; CBG 351-400: 20 u; CBG > 400: call MD    . insulin glargine (LANTUS) 100 UNIT/ML injection Inject 20 Units into the skin every morning.    Marland Kitchen LIALDA 1.2 g EC tablet TAKE 4 TABLETS BY MOUTH DAILY WITH BREAKFAST. 120 tablet 6  . Multiple Vitamin  (MULTIVITAMIN) tablet Take 1 tablet by mouth daily.    . ONE TOUCH ULTRA TEST test strip USE TO TEST BLOOD SUGAR 4 TIMES A DAY BEFORE MEALS AND AT BEDTIME 100 each PRN   No current facility-administered medications on file prior to visit.     BP (!) 149/94 (BP Location: Right Arm, Patient Position: Sitting, Cuff Size: Small)   Pulse 79   Temp 98.7 F (37.1 C) (Oral)   Resp 16   Ht 6' 0.5" (1.842 m)   Wt 177 lb 6.4 oz (80.5 kg)   SpO2 100%   BMI 23.73 kg/m       Objective:   Physical Exam  Constitutional: He is oriented to person, place, and time. He appears well-developed and well-nourished. No distress.  Cardiovascular: Normal rate and regular rhythm.  Abdominal: Soft. He exhibits no distension. There is no tenderness. There is no guarding.  Musculoskeletal: He exhibits no edema.  Neurological: He is alert and oriented to person, place, and time.  Skin: Skin is warm and dry.  Psychiatric: He has a normal mood and affect. His behavior is normal. Thought content normal.          Assessment & Plan:   Elevated blood pressure- Discussed low sodium diet, if still elevated next visit consider addition of ace.  BP Readings from Last 3 Encounters:  06/02/17 (!) 149/94  10/16/15 116/72  11/01/14 124/78   Ulcerative colitis- clinically stable. He is followed by GI and reports symptoms are stable. He is maintained on lialda   DM2- reports last a1C 6.4. Stable management per endo.

## 2017-06-02 NOTE — Patient Instructions (Signed)
Please work on a low sodium diet. Bring a copy of your lab work from Dr. Buddy Duty next visit. Welcome to [facility]!

## 2017-06-04 MED FILL — HUMALOG 100 UNITS/ML KWIKPE: 100 | 55 days supply | Qty: 15 | Fill #3

## 2017-06-27 MED FILL — LIALDA 1.2 GM TABLET SA: 1.2 | 30 days supply | Qty: 120 | Fill #6

## 2017-07-08 MED FILL — FREESTYLE LITE TEST STRIP: 90 days supply | Qty: 400 | Fill #0

## 2017-07-08 MED FILL — TOUJEO SOLOSTAR 300 UNITS/M: 300 | 57 days supply | Qty: 5 | Fill #0

## 2017-07-09 ENCOUNTER — Encounter: Payer: Self-pay | Admitting: Family

## 2017-07-10 ENCOUNTER — Telehealth: Payer: Self-pay

## 2017-07-10 NOTE — Telephone Encounter (Signed)
Author phoned pt. to reschedule appointment per pt's request. There is no availability with PCP until Monday 7/1, so pt. Agreed to keep 7/1 appointment.

## 2017-07-14 ENCOUNTER — Encounter: Payer: Self-pay | Admitting: Family

## 2017-07-14 ENCOUNTER — Ambulatory Visit (INDEPENDENT_AMBULATORY_CARE_PROVIDER_SITE_OTHER): Payer: 59 | Admitting: Family

## 2017-07-14 VITALS — BP 150/92 | HR 84 | Temp 98.7°F | Resp 16 | Ht 72.0 in | Wt 178.2 lb

## 2017-07-14 DIAGNOSIS — Z0000 Encounter for general adult medical examination without abnormal findings: Secondary | ICD-10-CM | POA: Diagnosis not present

## 2017-07-14 LAB — URINALYSIS, ROUTINE W REFLEX MICROSCOPIC
Bilirubin Urine: NEGATIVE
Hgb urine dipstick: NEGATIVE
Ketones, ur: NEGATIVE
Leukocytes, UA: NEGATIVE
Nitrite: NEGATIVE
RBC / HPF: NONE SEEN (ref 0–?)
Specific Gravity, Urine: <=1.005 — AB (ref 1.000–1.030)
Total Protein, Urine: NEGATIVE
Urine Glucose: NEGATIVE
Urobilinogen, UA: 0.2 (ref 0.0–1.0)
pH: 6 (ref 5.0–8.0)

## 2017-07-14 LAB — CBC WITH DIFFERENTIAL/PLATELET
Basophils Absolute: 0 10*3/uL (ref 0.0–0.1)
Basophils Relative: 0.8 % (ref 0.0–3.0)
Eosinophils Absolute: 0.2 10*3/uL (ref 0.0–0.7)
Eosinophils Relative: 4.2 % (ref 0.0–5.0)
HCT: 38.9 % — ABNORMAL LOW (ref 39.0–52.0)
Hemoglobin: 13.4 g/dL (ref 13.0–17.0)
Lymphocytes Relative: 32.8 % (ref 12.0–46.0)
Lymphs Abs: 1.5 10*3/uL (ref 0.7–4.0)
MCHC: 34.5 g/dL (ref 30.0–36.0)
MCV: 92.3 fl (ref 78.0–100.0)
Monocytes Absolute: 0.5 10*3/uL (ref 0.1–1.0)
Monocytes Relative: 10.9 % (ref 3.0–12.0)
Neutro Abs: 2.3 10*3/uL (ref 1.4–7.7)
Neutrophils Relative %: 51.3 % (ref 43.0–77.0)
Platelets: 221 10*3/uL (ref 150.0–400.0)
RBC: 4.22 Mil/uL (ref 4.22–5.81)
RDW: 13.1 % (ref 11.5–15.5)
WBC: 4.4 10*3/uL (ref 4.0–10.5)

## 2017-07-14 LAB — TSH: TSH: 2.13 u[IU]/mL (ref 0.35–4.50)

## 2017-07-14 NOTE — Progress Notes (Signed)
Subjective:    Patient ID: Kenneth [name], male    DOB: [birth year], 42 y.o.   MRN: [id number]  HPI  Patient presents today for complete physical.  Immunizations: reports that his last tetanus was 8 years ago.  He declines pneumovax. Diet: healthy, organic foods Exercise: some, active in his job, starting a new routine  HTN-  BP Readings from Last 3 Encounters:  07/14/17 (!) 150/92  06/02/17 (!) 149/94  10/16/15 116/72     Review of Systems  Constitutional: Negative for unexpected weight change.  HENT: Negative for rhinorrhea.   Eyes: Negative for visual disturbance.  Respiratory: Negative for cough and shortness of breath.   Cardiovascular: Negative for chest pain and leg swelling.  Gastrointestinal: Negative for blood in stool, constipation and diarrhea.  Neurological: Negative for headaches.  Hematological: Negative for adenopathy.  Psychiatric/Behavioral:       Denies depression/anxiety   Past Medical History:  Diagnosis Date  . Colitis   . Crohn's disease ([place])   . Steroid-induced hyperglycemia      Social History   Socioeconomic History  . Marital status: Married    Spouse name: Not on file  . Number of children: 0  . Years of education: Not on file  . Highest education level: Not on file  Occupational History  . Occupation: Training and development officer  Social Needs  . Financial resource strain: Not hard at all  . Food insecurity:    Worry: Never true    Inability: Never true  . Transportation needs:    Medical: No    Non-medical: No  Tobacco Use  . Smoking status: Former Smoker    Types: Cigarettes    Last attempt to quit: 07/01/2004    Years since quitting: 13.0  . Smokeless tobacco: Never Used  Substance and Sexual Activity  . Alcohol use: Yes    Comment: social  . Drug use: No  . Sexual activity: Yes    Partners: Female  Lifestyle  . Physical activity:    Days per week: 2 days    Minutes per session: 50 min  . Stress: Only a little  Relationships  . Social  connections:    Talks on phone: Once a week    Gets together: Not on file    Attends religious service: More than 4 times per year    Active member of club or organization: No    Attends meetings of clubs or organizations: Never    Relationship status: Married  . Intimate partner violence:    Fear of current or ex partner: No    Emotionally abused: No    Physically abused: No    Forced sexual activity: No  Other Topics Concern  . Not on file  Social History Narrative   Works as an Training and development officer Restaurant manager, fast food)   Married   No children   [employer], operational)   Enjoys hiking, spending time with his wife, reading/games    Past Surgical History:  Procedure Laterality Date  . HERNIA REPAIR  2010   left  . left thumb surgery  2000  . WISDOM TOOTH EXTRACTION  11/2010    Family History  Problem Relation Age of Onset  . Heart disease Paternal Grandfather   . Heart attack Paternal Grandfather   . GER disease Father   . Hyperlipidemia Maternal Grandmother   . Stroke Maternal Grandmother     Allergies  Allergen Reactions  . Moxifloxacin Other (See Comments)    Liver failure  .  Sulfonamide Derivatives Other (See Comments)    Liver failure    Current Outpatient Medications on File Prior to Visit  Medication Sig Dispense Refill  . Insulin Glargine (TOUJEO SOLOSTAR [place]) Inject 19 Units into the skin daily.    . insulin lispro (HUMALOG KWIKPEN) 100 UNIT/ML KiwkPen Inject into the skin. Use on a sliding scale before meals as needed.    Marland Kitchen LIALDA 1.2 g EC tablet TAKE 4 TABLETS BY MOUTH DAILY WITH BREAKFAST. 120 tablet 6  . OVER THE COUNTER MEDICATION CBD Oil.  As needed daily.    . TURMERIC PO Take 3 g by mouth daily.     No current facility-administered medications on file prior to visit.     BP (!) 150/92   Pulse 84   Temp 98.7 F (37.1 C) (Oral)   Resp 16   Ht 6' (1.829 m)   Wt 178 lb 3.2 oz (80.8 kg)   SpO2 100%   BMI 24.17 kg/m       Objective:   Physical  Exam Physical Exam  Constitutional: He is oriented to person, place, and time. He appears well-developed and well-nourished. No distress.  HENT:  Head: Normocephalic and atraumatic.  Right Ear: Tympanic membrane and ear canal normal.  Left Ear: Tympanic membrane and ear canal normal.  Mouth/Throat: Oropharynx is clear and moist.  Eyes: Pupils are equal, round, and reactive to light. No scleral icterus.  Neck: Normal range of motion. No thyromegaly present.  Cardiovascular: Normal rate and regular rhythm.   No murmur heard. Pulmonary/Chest: Effort normal and breath sounds normal. No respiratory distress. He has no wheezes. He has no rales. He exhibits no tenderness.  Abdominal: Soft. Bowel sounds are normal. He exhibits no distension and no mass. There is no tenderness. There is no rebound and no guarding.  Musculoskeletal: He exhibits no edema.  Lymphadenopathy:    He has no cervical adenopathy.  Neurological: He is alert and oriented to person, place, and time. He has normal patellar reflexes. He exhibits normal muscle tone. Coordination normal.  Skin: Skin is warm and dry.  Psychiatric: He has a normal mood and affect. His behavior is normal. Judgment and thought content normal.           Assessment & Plan:   Preventative care- discussed continuing healthy diet, exercise. Advised pt on pneumonax, he declines. Tetanus up to date.EKG tracing is personally reviewed.  EKG notes NSR.  No acute changes.   HTN- bp remains elevated. He reports bp has been better at home. Advised addition of lisinopril.  He declines at this time, would like to try to work on lifestyle first. He will follow up in  6 weeks.        Assessment & Plan:

## 2017-07-14 NOTE — Patient Instructions (Signed)
Please complete lab work prior to leaving. Continue healthy diet and regular exercise. Continue to check your blood pressure at home and send me your readings via mychart.

## 2017-07-29 ENCOUNTER — Other Ambulatory Visit: Payer: Self-pay | Admitting: Gastroenterology

## 2017-07-29 MED FILL — LIALDA 1.2 GM TABLET SA: 1.2 | 30 days supply | Qty: 120 | Fill #0

## 2017-08-18 MED FILL — ULTICARE PEN NDL 4MM 32G: 32G X 4 MM | 90 days supply | Qty: 300 | Fill #1

## 2017-08-18 MED FILL — HUMALOG 100 UNITS/ML KWIKPE: 100 | 55 days supply | Qty: 15 | Fill #4

## 2017-08-26 ENCOUNTER — Ambulatory Visit: Payer: 59

## 2017-08-29 MED FILL — LIALDA 1.2 GM TABLET SA: 1.2 | 30 days supply | Qty: 120 | Fill #1

## 2017-09-18 MED FILL — TOUJEO SOLOSTAR 300 UNITS/M: 300 | 57 days supply | Qty: 5 | Fill #1

## 2017-09-19 DIAGNOSIS — E099 Drug or chemical induced diabetes mellitus without complications: Secondary | ICD-10-CM | POA: Diagnosis not present

## 2017-09-19 DIAGNOSIS — Z794 Long term (current) use of insulin: Secondary | ICD-10-CM | POA: Diagnosis not present

## 2017-09-30 ENCOUNTER — Ambulatory Visit: Payer: 59 | Admitting: Gastroenterology

## 2017-09-30 ENCOUNTER — Telehealth: Payer: Self-pay | Admitting: Gastroenterology

## 2017-09-30 ENCOUNTER — Encounter: Payer: Self-pay | Admitting: Gastroenterology

## 2017-09-30 VITALS — BP 142/72 | HR 70 | Ht 72.0 in | Wt 175.0 lb

## 2017-09-30 DIAGNOSIS — K5180 Other ulcerative colitis without complications: Secondary | ICD-10-CM | POA: Diagnosis not present

## 2017-09-30 MED ORDER — MESALAMINE 1.2 G PO TBEC
DELAYED_RELEASE_TABLET | ORAL | 11 refills | Status: DC
Start: 2017-09-30 — End: 2018-10-05

## 2017-09-30 MED ORDER — MESALAMINE 1.2 G PO TBEC: 1.2000 g | DELAYED_RELEASE_TABLET | Freq: Every day | ORAL | 11 refills | Status: DC

## 2017-09-30 MED ORDER — PEG 3350-KCL-NA BICARB-NACL 420 G PO SOLR: 4000.0000 mL | ORAL | 0 refills | Status: DC

## 2017-09-30 MED FILL — LIALDA 1.2 GM TABLET SA: 1.2 | 30 days supply | Qty: 120 | Fill #0

## 2017-09-30 MED FILL — PEG-3350 SOLUTION: 420 | 1 days supply | Qty: 4000 | Fill #0

## 2017-09-30 NOTE — Progress Notes (Signed)
Review of pertinent gastrointestinal problems:  1. Left sided UC: Colonoscopy Dr. Penelope Coop 2006 up to 30cm. Mesalamine products did not seem to help very well. Significant flare summer 2009 steroids started. Had extreme hyperglycemia on steroids requiring insulin. Repeat colonoscopy December, 2009 showed inflammation moderate To 70 cm. He was started on sulfasalazine. Had drug rash, likely pneumonitis, LFT abnormalities ( transaminases up to almost 1000, bili up to 6). Holding his medicines resulted in complete resolution of abnormal labs checked serially. TPMT testing January, 2010 showed normal enzyme activity. February, 2010 tapered off all medicines and feels very well. May, 2010: flaring of symptoms for the past month, He went to see a [place] herbalist, did not start the oral mesalamine that I recommended, restarted prednisone. September, 2010 self-medicating with prednisone however tapering off. January, 2011, : doing well off traditional IBD medicines, taking Chinese Herb. 05/2011 severe flare, (abd pain, weight loss, bloody diarrhea Hb 6s), required admission, iv steroids, blood transfusion. October 2014: flare of symptoms (had stopped mesalamine 4 months prior); 01/2013 flare improved with prednisone and restarting lialda. Flared 06/2014, prednisone 16m BID. OV 10/2016 doing well on full dose lialda 2. swollen, somewhat thrombosed external hemorrhoid July 2013   HPI: This is a very pleasant 42year old man whom I last saw about 2 years ago  He has chronic left-sided ulcerative colitis under good control on oral mesalamine.  He has 2-3 soft nonbloody bowel movements in the morning.  He is not bothered by cramping or overt rectal bleeding.  His weight has been overall stable.  Chief complaint is chronic ulcerative colitis left-sided  ROS: complete GI ROS as described in HPI, all other review negative.  Constitutional:  No unintentional weight loss   Past Medical History:  Diagnosis Date  .  Colitis   . [place]   . Steroid-induced hyperglycemia     Past Surgical History:  Procedure Laterality Date  . HERNIA REPAIR  2010   left  . left thumb surgery  2000  . WISDOM TOOTH EXTRACTION  11/2010    Current Outpatient Medications  Medication Sig Dispense Refill  . Insulin Glargine (TOUJEO SOLOSTAR [place]) Inject 19 Units into the skin daily.    . insulin lispro (HUMALOG KWIKPEN) 100 UNIT/ML KiwkPen Inject into the skin. Use on a sliding scale before meals as needed.    .Marland KitchenLIALDA 1.2 g EC tablet TAKE 4 TABLETS BY MOUTH DAILY WITH BREAKFAST. 120 tablet 11  . OVER THE COUNTER MEDICATION CBD Oil.  As needed daily.    . TURMERIC PO Take 3 g by mouth daily.     No current facility-administered medications for this visit.     Allergies as of 09/30/2017 - Review Complete 09/30/2017  Allergen Reaction Noted  . Moxifloxacin Other (See Comments)   . Sulfonamide derivatives Other (See Comments)     Family History  Problem Relation Age of Onset  . Heart disease Paternal Grandfather   . Heart attack Paternal Grandfather   . GER disease Father   . Hyperlipidemia Maternal Grandmother   . Stroke Maternal Grandmother     Social History   Socioeconomic History  . Marital status: Married    Spouse name: Not on file  . Number of children: 0  . Years of education: Not on file  . Highest education level: Not on file  Occupational History  . Occupation: aTraining and development officer Social Needs  . Financial resource strain: Not hard at all  . Food insecurity:    Worry: Never  true    Inability: Never true  . Transportation needs:    Medical: No    Non-medical: No  Tobacco Use  . Smoking status: Former Smoker    Types: Cigarettes    Last attempt to quit: 07/01/2004    Years since quitting: 13.2  . Smokeless tobacco: Never Used  Substance and Sexual Activity  . Alcohol use: Yes    Comment: social  . Drug use: No  . Sexual activity: Yes    Partners: Female  Lifestyle  . Physical  activity:    Days per week: 2 days    Minutes per session: 50 min  . Stress: Only a little  Relationships  . Social connections:    Talks on phone: Once a week    Gets together: Not on file    Attends religious service: More than 4 times per year    Active member of club or organization: No    Attends meetings of clubs or organizations: Never    Relationship status: Married  . Intimate partner violence:    Fear of current or ex partner: No    Emotionally abused: No    Physically abused: No    Forced sexual activity: No  Other Topics Concern  . Not on file  Social History Narrative   Works as an Training and development officer Restaurant manager, fast food)   Married   No children   [employer] (egg Investment banker, operational)   Enjoys hiking, spending time with his wife, reading/games     Physical Exam: BP (!) 142/72   Pulse 70   Ht 6' (1.829 m)   Wt 175 lb (79.4 kg)   BMI 23.73 kg/m  Constitutional: generally well-appearing Psychiatric: alert and oriented x3 Abdomen: soft, nontender, nondistended, no obvious ascites, no peritoneal signs, normal bowel sounds No peripheral edema noted in lower extremities  Assessment and plan: 42 y.o. male with long-standing left-sided ulcerative colitis  He is doing well on oral mesalamine full dose once daily.  He will continue that for now.  We discussed that given his chronic inflammatory condition he is probably at increased risk for developing cancer growth in his colon and I recommended that he have a colonoscopy now for high risk screening.  If it is normal then I would likely recommend a repeat in 3 to 5 years or so.  Please see the "Patient Instructions" section for addition details about the plan.  Owens Loffler, MD [facility] 09/30/2017, 10:39 AM

## 2017-09-30 NOTE — Patient Instructions (Addendum)
You will be set up for a colonoscopy for chronic ulcerative colitis.  Stay on lialda 4 pills once daily.  We have sent the following medications to your pharmacy for you to pick up at your convenience: Doristine Johns  It has been recommended to you by your physician that you have a(n) colonoscopy completed. Per your request, we did not schedule the procedure(s) today. Please contact our office at [phone] should you decide to have the procedure completed.

## 2017-09-30 NOTE — Telephone Encounter (Signed)
[facility] calling regarding prescription for Lialda, order was sent for 1 tablet once daily. However, doctor Ardis Hughs notes indicate 4 pills once daily. Pharmacy needs new prescription order faxed over with correct dose.

## 2017-10-17 MED FILL — HUMALOG 100 UNITS/ML KWIKPE: 100 | 55 days supply | Qty: 15 | Fill #5

## 2017-11-04 MED FILL — LIALDA 1.2 GM TABLET SA: 1.2 | 30 days supply | Qty: 120 | Fill #1

## 2017-11-11 MED FILL — FREESTYLE LITE TEST STRIP: 90 days supply | Qty: 400 | Fill #1

## 2017-11-12 ENCOUNTER — Encounter: Payer: 59 | Admitting: Gastroenterology

## 2017-11-19 MED FILL — TOUJEO SOLOSTAR 300 UNITS/M: 300 | 57 days supply | Qty: 5 | Fill #2

## 2017-12-03 ENCOUNTER — Telehealth: Payer: Self-pay | Admitting: Gastroenterology

## 2017-12-03 NOTE — Telephone Encounter (Signed)
Spoke with patient. Patient request new instructions sent to his MyChart. Sent him new prep instructions to his MyChart. Patient aware.

## 2017-12-05 ENCOUNTER — Other Ambulatory Visit: Payer: Self-pay | Admitting: *Deleted

## 2017-12-08 MED FILL — LIALDA 1.2 GM TABLET SA: 1.2 | 30 days supply | Qty: 120 | Fill #2

## 2017-12-09 ENCOUNTER — Ambulatory Visit (AMBULATORY_SURGERY_CENTER): Payer: 59 | Admitting: Gastroenterology

## 2017-12-09 ENCOUNTER — Encounter: Payer: Self-pay | Admitting: Gastroenterology

## 2017-12-09 VITALS — BP 129/85 | HR 56 | Temp 97.3°F | Resp 16 | Ht 72.0 in | Wt 175.0 lb

## 2017-12-09 DIAGNOSIS — D120 Benign neoplasm of cecum: Secondary | ICD-10-CM

## 2017-12-09 DIAGNOSIS — D7218 Eosinophilia in diseases classified elsewhere: Secondary | ICD-10-CM

## 2017-12-09 DIAGNOSIS — K51819 Other ulcerative colitis with unspecified complications: Secondary | ICD-10-CM

## 2017-12-09 DIAGNOSIS — K5140 Inflammatory polyps of colon without complications: Secondary | ICD-10-CM | POA: Diagnosis not present

## 2017-12-09 DIAGNOSIS — K6389 Other specified diseases of intestine: Secondary | ICD-10-CM | POA: Diagnosis not present

## 2017-12-09 DIAGNOSIS — K635 Polyp of colon: Secondary | ICD-10-CM | POA: Diagnosis not present

## 2017-12-09 DIAGNOSIS — E109 Type 1 diabetes mellitus without complications: Secondary | ICD-10-CM | POA: Diagnosis not present

## 2017-12-09 DIAGNOSIS — K5190 Ulcerative colitis, unspecified, without complications: Secondary | ICD-10-CM | POA: Diagnosis not present

## 2017-12-09 MED ORDER — SODIUM CHLORIDE 0.9 % IV SOLN: 500.0000 mL | Freq: Once | INTRAVENOUS | Status: DC

## 2017-12-09 NOTE — Progress Notes (Signed)
Report to PACU, RN, vss, BBS= Clear.  

## 2017-12-09 NOTE — Op Note (Signed)
[place] Patient Name: Kenneth [name] Procedure Date: 12/09/2017 9:54 AM MRN: [id number] Endoscopist: Milus Banister , MD Age: 42 Referring MD:  Date of Birth: 1976/01/09 Gender: Male Account #: [id number] Procedure:                Colonoscopy Indications:              Chronic ulcerative pancolitis Medicines:                Monitored Anesthesia Care Procedure:                Pre-Anesthesia Assessment:                           - Prior to the procedure, a History and Physical                            was performed, and patient medications and                            allergies were reviewed. The patient's tolerance of                            previous anesthesia was also reviewed. The risks                            and benefits of the procedure and the sedation                            options and risks were discussed with the patient.                            All questions were answered, and informed consent                            was obtained. Prior Anticoagulants: The patient has                            taken no previous anticoagulant or antiplatelet                            agents. ASA Grade Assessment: II - A patient with                            mild systemic disease. After reviewing the risks                            and benefits, the patient was deemed in                            satisfactory condition to undergo the procedure.                           After obtaining informed consent, the colonoscope  was passed under direct vision. Throughout the                            procedure, the patient's blood pressure, pulse, and                            oxygen saturations were monitored continuously. The                            Colonoscope was introduced through the anus and                            advanced to the the terminal ileum. The colonoscopy                            was performed without  difficulty. The patient                            tolerated the procedure well. The quality of the                            bowel preparation was good. The terminal ileum,                            ileocecal valve, appendiceal orifice, and rectum                            were photographed. Scope In: 10:07:13 AM Scope Out: 10:28:08 AM Scope Withdrawal Time: 0 hours 16 minutes 55 seconds  Total Procedure Duration: 0 hours 20 minutes 55 seconds  Findings:                 1. The terminal ileum appeared normal.                           2. The colon mucosa was abnormal throughout.                            Appeared to have mild active inflammation in                            rectosigmoid region and 'burnt out' inflammation                            throughout all remaining segments. Multiple classic                            appearing pseudopolyps throughout. I sampled the                            right colon randomly (jar 2), left colon randomly                            (jar 3). There were more densly packed pseudopolyps  in the cecum, ? DAML (less likely), this was                            biospied extensively (jar 1).                           3. Penduculated, inflammed appearing sigmoid polyp                            (1.6cm). This was removed with hot snare (jar 4)                            and then the base around the polyp was biopsied                            extensively (jar 5) again ? DAML.                           4. The exam was otherwise without abnormality on                            direct and retroflexion views. Complications:            No immediate complications. Estimated blood loss:                            None. Estimated Blood Loss:     Estimated blood loss: none. Impression:               - The examined portion of the ileum was normal.                           - One 5 mm polyp in the sigmoid colon, removed with                             a hot snare. Resected and retrieved. Biopsied.                           - The examination was otherwise normal on direct                            and retroflexion views. Recommendation:           - Patient has a contact number available for                            emergencies. The signs and symptoms of potential                            delayed complications were discussed with the                            patient. Return to normal activities tomorrow.  Written discharge instructions were provided to the                            patient.                           - Resume previous diet.                           - Continue present medications.                           - Repeat colonoscopy is recommended. The                            colonoscopy date will be determined after pathology                            results from today's exam become available for                            review. Milus Banister, MD 12/09/2017 10:41:54 AM This report has been signed electronically.

## 2017-12-09 NOTE — Patient Instructions (Signed)
INFORMATION ON POLYPS GIVEN TO YOU TODAY  AWAIT PATHOLOGY ON POLYPS REMOVED TODAY    AWAIT PATHOLOGY ON BIOPSIES DONE TODAY   YOU HAD AN ENDOSCOPIC PROCEDURE TODAY AT [facility]:   Refer to the procedure report that was given to you for any specific questions about what was found during the examination.  If the procedure report does not answer your questions, please call your gastroenterologist to clarify.  If you requested that your care partner not be given the details of your procedure findings, then the procedure report has been included in a sealed envelope for you to review at your convenience later.  YOU SHOULD EXPECT: Some feelings of bloating in the abdomen. Passage of more gas than usual.  Walking can help get rid of the air that was put into your GI tract during the procedure and reduce the bloating. If you had a lower endoscopy (such as a colonoscopy or flexible sigmoidoscopy) you may notice spotting of blood in your stool or on the toilet paper. If you underwent a bowel prep for your procedure, you may not have a normal bowel movement for a few days.  Please Note:  You might notice some irritation and congestion in your nose or some drainage.  This is from the oxygen used during your procedure.  There is no need for concern and it should clear up in a day or so.  SYMPTOMS TO REPORT IMMEDIATELY:   Following lower endoscopy (colonoscopy or flexible sigmoidoscopy):  Excessive amounts of blood in the stool  Significant tenderness or worsening of abdominal pains  Swelling of the abdomen that is new, acute  Fever of 100F or higher   Following upper endoscopy (EGD)  Vomiting of blood or coffee ground material  New chest pain or pain under the shoulder blades  Painful or persistently difficult swallowing  New shortness of breath  Fever of 100F or higher  Black, tarry-looking stools  For urgent or emergent issues, a gastroenterologist can be reached at any  hour by calling [phone].   DIET:  We do recommend a small meal at first, but then you may proceed to your regular diet.  Drink plenty of fluids but you should avoid alcoholic beverages for 24 hours.  ACTIVITY:  You should plan to take it easy for the rest of today and you should NOT DRIVE or use heavy machinery until tomorrow (because of the sedation medicines used during the test).    FOLLOW UP: Our staff will call the number listed on your records the next business day following your procedure to check on you and address any questions or concerns that you may have regarding the information given to you following your procedure. If we do not reach you, we will leave a message.  However, if you are feeling well and you are not experiencing any problems, there is no need to return our call.  We will assume that you have returned to your regular daily activities without incident.  If any biopsies were taken you will be contacted by phone or by letter within the next 1-3 weeks.  Please call us at [phone] if you have not heard about the biopsies in 3 weeks.    SIGNATURES/CONFIDENTIALITY: You and/or your care partner have signed paperwork which will be entered into your electronic medical record.  These signatures attest to the fact that that the information above on your After Visit Summary has been reviewed and is understood.  Full  responsibility of the confidentiality of this discharge information lies with you and/or your care-partner.

## 2017-12-10 ENCOUNTER — Telehealth: Payer: Self-pay | Admitting: *Deleted

## 2017-12-10 NOTE — Telephone Encounter (Signed)
  Follow up Call-  Call back number 12/09/2017  Post procedure Call Back phone  # [phone]  Permission to leave phone message Yes  Some recent data might be hidden     Patient questions:  Do you have a fever, pain , or abdominal swelling? No. Pain Score  0 *  Have you tolerated food without any problems? Yes.    Have you been able to return to your normal activities? Yes.    Do you have any questions about your discharge instructions: Diet   No. Medications  No. Follow up visit  No.  Do you have questions or concerns about your Care? No.  Actions: * If pain score is 4 or above: No action needed, pain <4.

## 2017-12-15 MED FILL — HUMALOG 100 UNITS/ML KWIKPE: 100 | 90 days supply | Qty: 30 | Fill #0

## 2018-01-13 MED FILL — LIALDA 1.2 GM TABLET SA: 1.2 | 30 days supply | Qty: 120 | Fill #3

## 2018-01-13 MED FILL — TOUJEO SOLOSTAR 300 UNITS/M: 300 | 19 days supply | Qty: 2 | Fill #0

## 2018-01-15 DIAGNOSIS — E099 Drug or chemical induced diabetes mellitus without complications: Secondary | ICD-10-CM | POA: Diagnosis not present

## 2018-01-15 DIAGNOSIS — Z794 Long term (current) use of insulin: Secondary | ICD-10-CM | POA: Diagnosis not present

## 2018-01-21 DIAGNOSIS — E099 Drug or chemical induced diabetes mellitus without complications: Secondary | ICD-10-CM | POA: Diagnosis not present

## 2018-02-17 MED FILL — ULTICARE PEN NDL 4MM 32G: 32G X 4 MM | 90 days supply | Qty: 300 | Fill #2

## 2018-02-17 MED FILL — LIALDA 1.2 GM TABLET SA: 1.2 | 30 days supply | Qty: 120 | Fill #4

## 2018-02-17 MED FILL — FREESTYLE LITE TEST STRIP: 90 days supply | Qty: 400 | Fill #2

## 2018-03-20 MED FILL — TOUJEO SOLOSTAR 300 UNITS/M: 300 | 19 days supply | Qty: 2 | Fill #1

## 2018-03-23 MED FILL — LIALDA 1.2 GM TABLET SA: 1.2 | 30 days supply | Qty: 120 | Fill #5

## 2018-03-25 DIAGNOSIS — Z794 Long term (current) use of insulin: Secondary | ICD-10-CM | POA: Diagnosis not present

## 2018-03-25 DIAGNOSIS — R82998 Other abnormal findings in urine: Secondary | ICD-10-CM | POA: Diagnosis not present

## 2018-03-25 DIAGNOSIS — E099 Drug or chemical induced diabetes mellitus without complications: Secondary | ICD-10-CM | POA: Diagnosis not present

## 2018-04-03 ENCOUNTER — Encounter: Payer: Self-pay | Admitting: Family

## 2018-04-03 ENCOUNTER — Ambulatory Visit: Payer: 59 | Admitting: Family

## 2018-04-03 ENCOUNTER — Telehealth: Payer: Self-pay | Admitting: Family

## 2018-04-03 ENCOUNTER — Other Ambulatory Visit: Payer: Self-pay

## 2018-04-03 VITALS — BP 144/93 | HR 79 | Temp 99.0°F | Resp 16 | Ht 72.0 in | Wt 172.0 lb

## 2018-04-03 DIAGNOSIS — E119 Type 2 diabetes mellitus without complications: Secondary | ICD-10-CM | POA: Diagnosis not present

## 2018-04-03 DIAGNOSIS — S46912A Strain of unspecified muscle, fascia and tendon at shoulder and upper arm level, left arm, initial encounter: Secondary | ICD-10-CM | POA: Diagnosis not present

## 2018-04-03 DIAGNOSIS — R030 Elevated blood-pressure reading, without diagnosis of hypertension: Secondary | ICD-10-CM

## 2018-04-03 DIAGNOSIS — Z794 Long term (current) use of insulin: Secondary | ICD-10-CM | POA: Diagnosis not present

## 2018-04-03 MED ORDER — MELOXICAM 7.5 MG PO TABS: 7.5000 mg | ORAL_TABLET | Freq: Every day | ORAL | 0 refills | Status: DC

## 2018-04-03 MED FILL — MELOXICAM 7.5 MG TABLET: 7.5 | 14 days supply | Qty: 14 | Fill #0

## 2018-04-03 NOTE — Patient Instructions (Signed)
Begin meloxicam once daily for left sided shoulder pain. Call if symptoms worsen or if not improved in 1 week.

## 2018-04-03 NOTE — Telephone Encounter (Signed)
See mychart.  
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
## 2018-04-06 MED FILL — HUMALOG 100 UNITS/ML KWIKPE: 100 | 90 days supply | Qty: 30 | Fill #1

## 2018-04-06 MED FILL — TOUJEO SOLOSTAR 300 UNITS/M: 300 | 19 days supply | Qty: 2 | Fill #2

## 2018-04-13 ENCOUNTER — Encounter: Payer: Self-pay | Admitting: Family

## 2018-04-24 MED FILL — LIALDA 1.2 GM TABLET SA: 1.2 | 30 days supply | Qty: 120 | Fill #6

## 2018-04-24 MED FILL — TOUJEO SOLOSTAR 300 UNITS/M: 300 | 19 days supply | Qty: 2 | Fill #3

## 2018-05-14 MED FILL — TOUJEO SOLOSTAR 300 UNITS/M: 300 | 19 days supply | Qty: 2 | Fill #4

## 2018-05-25 MED FILL — LIALDA 1.2 GM TABLET SA: 1.2 | 30 days supply | Qty: 120 | Fill #7

## 2018-06-05 MED FILL — TOUJEO SOLOSTAR 300 UNITS/M: 300 | 19 days supply | Qty: 2 | Fill #5

## 2018-06-10 MED FILL — FREESTYLE LITE TEST STRIP: 90 days supply | Qty: 400 | Fill #3

## 2018-06-25 MED FILL — TOUJEO SOLOSTAR 300 UNITS/M: 300 | 19 days supply | Qty: 2 | Fill #6

## 2018-06-26 DIAGNOSIS — Z794 Long term (current) use of insulin: Secondary | ICD-10-CM | POA: Diagnosis not present

## 2018-06-26 DIAGNOSIS — E1165 Type 2 diabetes mellitus with hyperglycemia: Secondary | ICD-10-CM | POA: Diagnosis not present

## 2018-06-26 DIAGNOSIS — E099 Drug or chemical induced diabetes mellitus without complications: Secondary | ICD-10-CM | POA: Diagnosis not present

## 2018-07-02 MED FILL — LIALDA 1.2 GM TABLET SA: 1.2 | 30 days supply | Qty: 120 | Fill #8

## 2018-07-14 MED FILL — FREESTYLE LIBRE 14 DAY READ: 28 days supply | Qty: 1 | Fill #0

## 2018-07-14 MED FILL — FREESTYLE LIBRE 14 DAY SENS: 84 days supply | Qty: 6 | Fill #0

## 2018-07-16 MED FILL — TOUJEO SOLOSTAR 300 UNITS/M: 300 | 19 days supply | Qty: 2 | Fill #7

## 2018-07-27 DIAGNOSIS — L814 Other melanin hyperpigmentation: Secondary | ICD-10-CM | POA: Diagnosis not present

## 2018-07-27 DIAGNOSIS — D225 Melanocytic nevi of trunk: Secondary | ICD-10-CM | POA: Diagnosis not present

## 2018-07-27 DIAGNOSIS — D2272 Melanocytic nevi of left lower limb, including hip: Secondary | ICD-10-CM | POA: Diagnosis not present

## 2018-08-03 MED FILL — LIALDA 1.2 GM TABLET SA: 1.2 | 30 days supply | Qty: 120 | Fill #9

## 2018-08-03 MED FILL — TOUJEO SOLOSTAR 300 UNITS/M: 300 | 19 days supply | Qty: 2 | Fill #8

## 2018-08-12 MED FILL — HUMALOG 100 UNITS/ML KWIKPE: 100 | 90 days supply | Qty: 30 | Fill #2

## 2018-08-13 MED FILL — TOUJEO SOLOSTAR 300 UNITS/M: 300 | 19 days supply | Qty: 2 | Fill #9

## 2018-08-26 MED FILL — ULTICARE PEN NDL 4MM 32G: 32G X 4 MM | 90 days supply | Qty: 300 | Fill #0

## 2018-09-01 MED FILL — LIALDA 1.2 GM TABLET SA: 1.2 | 30 days supply | Qty: 120 | Fill #10

## 2018-09-01 MED FILL — TOUJEO SOLOSTAR 300 UNITS/M: 300 | 19 days supply | Qty: 2 | Fill #10

## 2018-09-02 ENCOUNTER — Encounter: Payer: Self-pay | Admitting: Gastroenterology

## 2018-09-10 ENCOUNTER — Encounter: Payer: Self-pay | Admitting: Gastroenterology

## 2018-09-22 MED FILL — TOUJEO SOLOSTAR 300 UNITS/M: 300 | 19 days supply | Qty: 2 | Fill #11

## 2018-09-28 MED FILL — FREESTYLE LITE TEST STRIP: 90 days supply | Qty: 400 | Fill #0

## 2018-10-05 ENCOUNTER — Other Ambulatory Visit: Payer: Self-pay | Admitting: Gastroenterology

## 2018-10-05 MED FILL — LIALDA 1.2 GM TABLET SA: 1.2 | 30 days supply | Qty: 120 | Fill #0

## 2018-10-07 MED FILL — TOUJEO SOLOSTAR 300 UNITS/M: 300 | 27 days supply | Qty: 5 | Fill #0

## 2018-10-20 ENCOUNTER — Encounter

## 2018-10-20 DIAGNOSIS — E099 Drug or chemical induced diabetes mellitus without complications: Secondary | ICD-10-CM | POA: Diagnosis not present

## 2018-10-20 DIAGNOSIS — Z794 Long term (current) use of insulin: Secondary | ICD-10-CM | POA: Diagnosis not present

## 2018-10-27 ENCOUNTER — Ambulatory Visit: Payer: 59 | Admitting: Gastroenterology

## 2018-11-05 ENCOUNTER — Encounter: Attending: Cardiovascular Disease

## 2018-11-06 MED FILL — LIALDA 1.2 GM TABLET SA: 1.2 | 30 days supply | Qty: 120 | Fill #1

## 2018-11-18 ENCOUNTER — Other Ambulatory Visit: Payer: Self-pay

## 2018-11-18 ENCOUNTER — Encounter: Payer: Self-pay | Admitting: Gastroenterology

## 2018-11-18 ENCOUNTER — Ambulatory Visit: Payer: 59 | Admitting: Gastroenterology

## 2018-11-18 VITALS — BP 138/82 | HR 77 | Temp 98.5°F | Ht 73.0 in | Wt 173.0 lb

## 2018-11-18 DIAGNOSIS — K51919 Ulcerative colitis, unspecified with unspecified complications: Secondary | ICD-10-CM

## 2018-11-18 NOTE — Patient Instructions (Signed)
Continue taking Lialda - 4 pills a day  Please follow up in one year

## 2018-11-18 NOTE — Progress Notes (Signed)
Review of pertinent gastrointestinal problems:  1. Left sided UC: Colonoscopy Dr. Penelope Coop 2006 up to 30cm. Mesalamine products did not seem to help very well. Significant flare summer 2009 steroids started. Had extreme hyperglycemia on steroids requiring insulin. Repeat colonoscopy December, 2009 showed inflammation moderate To 70 cm.He was started on sulfasalazine. Had drug rash, likely pneumonitis, LFT abnormalities ( transaminases up to almost 1000, bili up to 6). Holding his medicines resulted in complete resolution of abnormal labs checked serially. TPMT testing January, 2010 showed normal enzyme activity. February, 2010 tapered off all medicines and feels very well. May, 2010: flaring of symptoms for the past month, He went to see a [place] herbalist, did not start the oral mesalamine that I recommended, restarted prednisone. September, 2010 self-medicating with prednisone however tapering off. January, 2011, : doing well off traditional IBD medicines, taking Chinese Herb. 05/2011 severe flare, (abd pain, weight loss, bloody diarrhea Hb 6s), required admission, iv steroids, blood transfusion. October 2014: flare of symptoms (had stopped mesalamine 4 months prior); 01/2013 flare improved with prednisone and restarting lialda. Flared 06/2014, prednisone 16m BID. OV 10/2016 doing well on full dose lialda.  Colonoscopy November 2019 for chronic colitis showed normal terminal ileum, colonic mucosa was abnormal throughout.  To have mild active inflammation in the rectosigmoid.  And burned-out inflammation throughout remaining segments.  Multiple pseudopolyps throughout.  Colon was biopsied extensively.  Biopsies showed no sign of dysplasia and no active or chronic inflammation.  I recommended recall at 3 years 2. swollen, somewhat thrombosed external hemorrhoid July 2013   HPI: This is a very pleasant 43year old man whom I last saw about a year ago the time of a colonoscopy.  See those results summarized  above  Since then he has done very well from a colitis standpoint.  He takes his Lialda 4 pills once daily.  He has 2 sometimes 3 bowel movements a day they are usually solid.  They are never bloody.  He has no abdominal pains.  His weight has been stable.  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  I know his wife, she previously was in oncology nurse.  She has recovered from her breast cancer and actually had uveitis which required some kind of a steroid injection surgery recently.  She is recovered from that and sounds like they are both doing quite well.  Moving to a new house with 8 acres.  He will have his art workshop on the property.  Chief complaint is ulcerative colitis  ROS: complete GI ROS as described in HPI, all other review negative.  Constitutional:  No unintentional weight loss   Past Medical History:  Diagnosis Date  . Colitis   . Crohn's disease ([place]   . Diabetes mellitus without complication ([place]   . Steroid-induced hyperglycemia     Past Surgical History:  Procedure Laterality Date  . HERNIA REPAIR  2010   left  . left thumb surgery  2000  . WISDOM TOOTH EXTRACTION  11/2010    Current Outpatient Medications  Medication Sig Dispense Refill  . Insulin Glargine (TOUJEO SOLOSTAR [place]) Inject 19 Units into the skin daily.    . insulin lispro (HUMALOG KWIKPEN) 100 UNIT/ML KiwkPen Inject into the skin. Use on a sliding scale before meals as needed.    . mesalamine (LIALDA) 1.2 g EC tablet TAKE 4 TABLETS BY MOUTH DAILY 120 tablet 11  . OVER THE  COUNTER MEDICATION CBD Oil.  As needed daily.    . TURMERIC PO Take 3 g by mouth daily.     No current facility-administered medications for this visit.     Allergies as of 11/18/2018 - Review Complete 11/18/2018  Allergen Reaction Noted  . Moxifloxacin Other (See Comments)   . Sulfonamide derivatives Other (See Comments)     Family History   Problem Relation Age of Onset  . Heart disease Paternal Grandfather   . Heart attack Paternal Grandfather   . GER disease Father   . Hyperlipidemia Maternal Grandmother   . Stroke Maternal Grandmother   . Colon cancer Neg Hx   . Rectal cancer Neg Hx   . Stomach cancer Neg Hx   . Pancreatic cancer Neg Hx     Social History   Socioeconomic History  . Marital status: Married    Spouse name: Not on file  . Number of children: 0  . Years of education: Not on file  . Highest education level: Not on file  Occupational History  . Occupation: Training and development officer  Social Needs  . Financial resource strain: Not hard at all  . Food insecurity    Worry: Never true    Inability: Never true  . Transportation needs    Medical: No    Non-medical: No  Tobacco Use  . Smoking status: Former Smoker    Types: Cigarettes    Quit date: 07/01/2004    Years since quitting: 14.3  . Smokeless tobacco: Never Used  Substance and Sexual Activity  . Alcohol use: Yes    Comment: social  . Drug use: No  . Sexual activity: Yes    Partners: Female  Lifestyle  . Physical activity    Days per week: 2 days    Minutes per session: 50 min  . Stress: Only a little  Relationships  . Social Herbalist on phone: Once a week    Gets together: Not on file    Attends religious service: More than 4 times per year    Active member of club or organization: No    Attends meetings of clubs or organizations: Never    Relationship status: Married  . Intimate partner violence    Fear of current or ex partner: No    Emotionally abused: No    Physically abused: No    Forced sexual activity: No  Other Topics Concern  . Not on file  Social History Narrative   Works as an Training and development officer Restaurant manager, fast food)   Married   No children   [employer] (egg Investment banker, operational)   Enjoys hiking, spending time with his wife, reading/games     Physical Exam: BP 138/82   Pulse 77   Temp 98.5 F (36.9 C)   Ht 6' 1"  (1.854 m)   Wt 173  lb (78.5 kg)   BMI 22.82 kg/m  Constitutional: generally well-appearing Psychiatric: alert and oriented x3 Abdomen: soft, nontender, nondistended, no obvious ascites, no peritoneal signs, normal bowel sounds No peripheral edema noted in lower extremities  Assessment and plan: 43 y.o. male with ulcerative colitis  Symptoms are under very good control on full-strength mesalamine orally.  He will continue it indefinitely for now.  He will return to see me in 1 year and sooner if any troubles.  Please see the "Patient Instructions" section for addition details about the plan.  Owens Loffler, MD [facility] 11/18/2018, 3:26 PM

## 2018-11-30 ENCOUNTER — Other Ambulatory Visit: Payer: Self-pay

## 2018-12-01 ENCOUNTER — Encounter: Payer: Self-pay | Admitting: Family

## 2018-12-01 ENCOUNTER — Encounter: Payer: Self-pay | Admitting: Neurology

## 2018-12-01 ENCOUNTER — Ambulatory Visit: Payer: 59 | Admitting: Family

## 2018-12-01 VITALS — BP 142/80 | HR 79 | Temp 97.2°F | Resp 16 | Ht 73.0 in | Wt 173.0 lb

## 2018-12-01 DIAGNOSIS — R200 Anesthesia of skin: Secondary | ICD-10-CM

## 2018-12-01 DIAGNOSIS — R208 Other disturbances of skin sensation: Secondary | ICD-10-CM

## 2018-12-01 NOTE — Patient Instructions (Signed)
You should be contacted about your referral to Neurology.  Please call me sooner if you develop worsening symptoms.

## 2018-12-01 NOTE — Progress Notes (Signed)
Subjective:    Patient ID: Kenneth [name], male    DOB: [birth year], 43 y.o.   MRN: [id number]  HPI   Patient is a 43 yr old male who presents today with some new symptoms in his left foot.  Symptoms started a few weeks ago. Notes that he has developed some mild numbness in the left inner foot. Also notes that it feels like he drops his left foot heavier than the right. Notes decreased dorsiflexion.  Denies associated back pain. Has been seeing a chiropractor regularly.   Review of Systems    see HPI  Past Medical History:  Diagnosis Date  . Colitis   . Crohn's disease ([place])   . Diabetes mellitus without complication ([place])   . Steroid-induced hyperglycemia      Social History   Socioeconomic History  . Marital status: Married    Spouse name: Not on file  . Number of children: 0  . Years of education: Not on file  . Highest education level: Not on file  Occupational History  . Occupation: Training and development officer  Social Needs  . Financial resource strain: Not hard at all  . Food insecurity    Worry: Never true    Inability: Never true  . Transportation needs    Medical: No    Non-medical: No  Tobacco Use  . Smoking status: Former Smoker    Types: Cigarettes    Quit date: 07/01/2004    Years since quitting: 14.4  . Smokeless tobacco: Never Used  Substance and Sexual Activity  . Alcohol use: Yes    Comment: social  . Drug use: No  . Sexual activity: Yes    Partners: Female  Lifestyle  . Physical activity    Days per week: 2 days    Minutes per session: 50 min  . Stress: Only a little  Relationships  . Social Herbalist on phone: Once a week    Gets together: Not on file    Attends religious service: More than 4 times per year    Active member of club or organization: No    Attends meetings of clubs or organizations: Never    Relationship status: Married  . Intimate partner violence    Fear of current or ex partner: No    Emotionally abused: No    Physically  abused: No    Forced sexual activity: No  Other Topics Concern  . Not on file  Social History Narrative   Works as an Training and development officer Restaurant manager, fast food)   Married   No children   [employer] (egg Investment banker, operational)   Enjoys hiking, spending time with his wife, reading/games    Past Surgical History:  Procedure Laterality Date  . HERNIA REPAIR  2010   left  . left thumb surgery  2000  . WISDOM TOOTH EXTRACTION  11/2010    Family History  Problem Relation Age of Onset  . Heart disease Paternal Grandfather   . Heart attack Paternal Grandfather   . GER disease Father   . Hyperlipidemia Maternal Grandmother   . Stroke Maternal Grandmother   . Colon cancer Neg Hx   . Rectal cancer Neg Hx   . Stomach cancer Neg Hx   . Pancreatic cancer Neg Hx     Allergies  Allergen Reactions  . Moxifloxacin Other (See Comments)    Liver failure  . Sulfonamide Derivatives Other (See Comments)    Liver failure    Current Outpatient Medications on File  Prior to Visit  Medication Sig Dispense Refill  . Insulin Glargine (TOUJEO SOLOSTAR [place]) Inject 19 Units into the skin daily.    . insulin lispro (HUMALOG KWIKPEN) 100 UNIT/ML KiwkPen Inject into the skin. Use on a sliding scale before meals as needed.    . mesalamine (LIALDA) 1.2 g EC tablet TAKE 4 TABLETS BY MOUTH DAILY 120 tablet 11  . OVER THE COUNTER MEDICATION CBD Oil.  As needed daily.    . TURMERIC PO Take 3 g by mouth daily.     No current facility-administered medications on file prior to visit.     BP (!) 142/80 (BP Location: Right Arm, Patient Position: Sitting, Cuff Size: Normal)   Pulse 79   Temp (!) 97.2 F (36.2 C) (Temporal)   Resp 16   Ht 6\' 1"  (1.854 m)   Wt 173 lb (78.5 kg)   SpO2 98%   BMI 22.82 kg/m    Objective:   Physical Exam Constitutional:      General: He is not in acute distress.    Appearance: He is well-developed.  HENT:     Head: Normocephalic and atraumatic.  Cardiovascular:     Rate and Rhythm: Normal rate  and regular rhythm.     Heart sounds: No murmur.  Pulmonary:     Effort: Pulmonary effort is normal. No respiratory distress.     Breath sounds: Normal breath sounds. No wheezing or rales.  Skin:    General: Skin is warm and dry.  Neurological:     Mental Status: He is alert and oriented to person, place, and time.     Deep Tendon Reflexes:     Reflex Scores:      Patellar reflexes are 3+ on the right side and 3+ on the left side.    Comments: Diminished sensation to monofilament left medial foot/ankle.   Decreased left dorsiflexion noted compared to right.  No obvious foot drop on exam but pt states a subjective sense of foot drop  Psychiatric:        Behavior: Behavior normal.        Thought Content: Thought content normal.           Assessment & Plan:  Left foot numbness- New problem. Pt with decreased dorsiflexion, mild left foot drop. Will refer to neurology for further evaluation.

## 2018-12-02 ENCOUNTER — Telehealth: Payer: Self-pay | Admitting: Family

## 2018-12-02 MED FILL — TOUJEO SOLOSTAR 300 UNITS/M: 300 | 27 days supply | Qty: 5 | Fill #1

## 2018-12-02 MED FILL — LIALDA 1.2 GM TABLET SA: 1.2 | 30 days supply | Qty: 120 | Fill #2

## 2018-12-02 NOTE — Telephone Encounter (Signed)
Opened in error

## 2019-01-06 MED FILL — HUMALOG 100 UNITS/ML KWIKPE: 100 | 83 days supply | Qty: 30 | Fill #0

## 2019-01-07 ENCOUNTER — Other Ambulatory Visit: Payer: Self-pay

## 2019-01-11 ENCOUNTER — Ambulatory Visit: Payer: 59 | Admitting: Neurology

## 2019-01-13 MED FILL — LIALDA 1.2 GM TABLET SA: 1.2 | 30 days supply | Qty: 120 | Fill #3

## 2019-02-09 MED FILL — FREESTYLE LITE TEST STRIP: 90 days supply | Qty: 400 | Fill #1

## 2019-02-09 MED FILL — LIALDA 1.2 GM TABLET SA: 1.2 | 30 days supply | Qty: 120 | Fill #4

## 2019-02-12 MED FILL — TOUJEO SOLOSTAR 300 UNITS/M: 300 | 27 days supply | Qty: 5 | Fill #2

## 2019-02-15 ENCOUNTER — Other Ambulatory Visit: Payer: Self-pay

## 2019-02-15 ENCOUNTER — Encounter: Payer: Self-pay | Admitting: Neurology

## 2019-02-15 ENCOUNTER — Ambulatory Visit (INDEPENDENT_AMBULATORY_CARE_PROVIDER_SITE_OTHER): Payer: 59 | Admitting: Neurology

## 2019-02-15 VITALS — BP 158/90 | HR 74 | Ht 73.0 in | Wt 170.0 lb

## 2019-02-15 DIAGNOSIS — G5732 Lesion of lateral popliteal nerve, left lower limb: Secondary | ICD-10-CM

## 2019-02-15 NOTE — Patient Instructions (Signed)
Common Peroneal Nerve Entrapment  Common peroneal nerve entrapment is a condition that can make it hard to lift a foot. The condition results from pressure on a nerve in the lower leg called the common peroneal nerve. Your common peroneal nerve provides feeling to your outer lower leg and foot. It also supplies the muscles that move your foot and toes upward and outward. What are the causes? This condition may be caused by:  Sitting cross-legged, squatting, or kneeling for long periods of time.  A hard, direct hit to the side of the lower leg.  Swelling from a knee injury.  A break (fracture) in one of the lower leg bones.  Wearing a boot or cast that ends just below the knee.  A growth or cyst near the nerve. What increases the risk? This condition is more likely to develop in people who play:  Contact sports, such as football or hockey.  Sports where you wear high and stiff boots, such as skiing. What are the signs or symptoms? Symptoms of this condition include:  Trouble lifting your foot up (foot drop).  Tripping often.  Your foot hitting the ground harder than normal as you walk.  Numbness, tingling, or pain in the outside of the knee, outside of the lower leg, and top of the foot.  Sensitivity to pressure on the front or side of the leg. How is this diagnosed? This condition may be diagnosed based on:  Your symptoms.  Your medical history.  A physical exam.  Tests, such as: ? An X-ray to check the bones of your knee and leg. ? MRI to check tendons that attach to the side of your knee. ? An ultrasound to check for a growth or cyst. ? An electromyogram (EMG) to check your nerves. During your physical exam, your health care provider will check for numbness in your leg and test the strength of your lower leg muscles. He or she may tap the side of your lower leg to see if that causes tingling. How is this treated? Treatment for this condition may  include:  Avoiding activities that make symptoms worse.  Using a brace to hold up your foot and toes.  Taking anti-inflammatory pain medicines to relieve swelling and lessen pain.  Having medicines injected into your ankle joint to lessen pain and swelling.  Doing exercises to help you regain or maintain movement (physical therapy).  Surgery to take pressure off the nerve. This may be needed if there is no improvement after 2-3 months or if there is a growth pushing on the nerve.  Returning gradually to full activity. Follow these instructions at home: If you have a brace:  Wear it as told by your health care provider. Remove it only as told by your health care provider.  Loosen the brace if your toes tingle, become numb, or turn cold and blue.  Keep the brace clean.  If the brace is not waterproof: ? Do not let it get wet. ? Cover it with a watertight covering when you take a bath or a shower.  Ask your health care provider when it is safe to drive with a brace on your foot. Activity  Return to your normal activities as told by your health care provider. Ask your health care provider what activities are safe for you.  Do not do any activities that make pain or swelling worse.  Do exercises as told by your health care provider. General instructions  Take over-the-counter and prescription medicines  only as told by your health care provider.  Do not put your full weight on your knee until your health care provider says you can. Use crutches as directed by your health care provider.  Keep all follow-up visits as told by your health care provider. This is important. How is this prevented?  Wear supportive footwear that is appropriate for your athletic activity.  Avoid athletic activities that cause ankle pain or swelling.  Wear protective padding over your lower legs when playing contact sports.  Make sure your boots do not put extra pressure on the area just below your  knees.  Do not sit cross-legged for long periods of time. Contact a health care provider if:  Your symptoms do not get better in 2-3 months.  The weakness or numbness in your leg or foot gets worse. Summary  Common peroneal nerve entrapment is a condition that results from pressure on a nerve in the lower leg called the common peroneal nerve.  This condition may be caused by a hard hit, swelling, a fracture, or a cyst in the lower leg.  Treatment may include rest, a brace, medicines, and physical therapy. Sometimes surgery is needed.  Do not do any activities that make pain or swelling worse. This information is not intended to replace advice given to you by your health care provider. Make sure you discuss any questions you have with your health care provider. Document Revised: 11/10/2017 Document Reviewed: 11/10/2017 Elsevier Patient Education  2020 [employer].

## 2019-02-15 NOTE — Progress Notes (Signed)
[facility] Clinic Note - Initial Visit   Date: 02/15/19  Kenneth [name] MRN: [id number] DOB: [birth year]   Dear Debbrah Alar, NP:  Thank you for your kind referral of Kenneth [name] for consultation of left foot numbness. Although his history is well known to you, please allow [place] to reiterate it for the purpose of our medical record. The patient was accompanied to the clinic by self.    History of Present Illness: Kenneth [name] is a 44 y.o. right-handed male with Crohn's disease and steroid-induced hyperglycemia presenting for evaluation of left foot numbness and weakness.   Starting around the fall 2020, he developed insidious onset of left numbness involving the left foot and lower leg.  When examining his foot at home, he noticed that he was unable to extend his big toe fully and he has mild weakness in the foot.  There was no associated back pain or radicular leg pain.  Over the past month, he has noticed that his range of motion in the foot and toe has improved.  There has been no change in the extent of his numbness.  He denies numbness in the right leg or hands. He crosses his legs every now and then.  He denies wearing tight constrictive clothing at the knee.  He stays active and is able to maintain regular level of physical activity (kick boxing, tennis, etc).   Out-side paper records, electronic medical record, and images have been reviewed where available and summarized as:  Lab Results  Component Value Date   HGBA1C 7.4 05/23/2017   No results found for: HYIFOYDX41 Lab Results  Component Value Date   TSH 2.13 07/14/2017   Lab Results  Component Value Date   ESRSEDRATE 32 (H) 11/06/2007    Past Medical History:  Diagnosis Date  . Colitis   . Crohn's disease ([place])   . Diabetes mellitus without complication ([place])   . Steroid-induced hyperglycemia     Past Surgical History:  Procedure Laterality Date  . HERNIA  REPAIR  2010   left  . left thumb surgery  2000  . WISDOM TOOTH EXTRACTION  11/2010     Medications:  Outpatient Encounter Medications as of 02/15/2019  Medication Sig  . Insulin Glargine (TOUJEO SOLOSTAR [place]) Inject 19 Units into the skin daily.  . insulin lispro (HUMALOG KWIKPEN) 100 UNIT/ML KiwkPen Inject into the skin. Use on a sliding scale before meals as needed.  . mesalamine (LIALDA) 1.2 g EC tablet TAKE 4 TABLETS BY MOUTH DAILY  . OVER THE COUNTER MEDICATION CBD Oil.  As needed daily.  . TURMERIC PO Take 3 g by mouth daily.   No facility-administered encounter medications on file as of 02/15/2019.    Allergies:  Allergies  Allergen Reactions  . Moxifloxacin Other (See Comments)    Liver failure  . Sulfonamide Derivatives Other (See Comments)    Liver failure    Family History: Family History  Problem Relation Age of Onset  . Heart disease Paternal Grandfather   . Heart attack Paternal Grandfather   . GER disease Father   . Hyperlipidemia Maternal Grandmother   . Stroke Maternal Grandmother   . Colon cancer Neg Hx   . Rectal cancer Neg Hx   . Stomach cancer Neg Hx   . Pancreatic cancer Neg Hx     Social History: Social History   Tobacco Use  . Smoking status: Former Smoker    Types: Cigarettes    Quit date:  07/01/2004    Years since quitting: 14.6  . Smokeless tobacco: Never Used  Substance Use Topics  . Alcohol use: Yes    Comment: social  . Drug use: No   Social History   Social History Narrative   Works as an Training and development officer Restaurant manager, fast food)   Married   No children   [employer] (egg Investment banker, operational)   Enjoys hiking, spending time with his wife, reading/games    Vital Signs:  BP (!) 158/90   Pulse 74   Ht 6' 1"  (1.854 m)   Wt 170 lb (77.1 kg)   SpO2 98%   BMI 22.43 kg/m    General Medical Exam:   General:  Well appearing, comfortable.   Eyes/ENT: see cranial nerve examination.   Neck:   No carotid bruits. Respiratory:  Clear to auscultation, good  air entry bilaterally.   Cardiac:  Regular rate and rhythm, no murmur.   Extremities:  No deformities, edema, or skin discoloration.  Skin:  No rashes or lesions.  Neurological Exam: MENTAL STATUS including orientation to time, place, person, recent and remote memory, attention span and concentration, language, and fund of knowledge is normal.  Speech is not dysarthric.  CRANIAL NERVES: II:  No visual field defects. .   III-IV-VI: Pupils equal round and reactive to light.  Normal conjugate, extra-ocular eye movements in all directions of gaze.  No nystagmus.  No ptosis.   V:  Normal facial sensation.    VII:  Normal facial symmetry and movements.   VIII:  Normal hearing and vestibular function.   IX-X:  Normal palatal movement.   XI:  Normal shoulder shrug and head rotation.   XII:  Normal tongue strength and range of motion, no deviation or fasciculation.  MOTOR:  No atrophy, fasciculations or abnormal movements.  No pronator drift.   Upper Extremity:  Right  Left  Deltoid  5/5   5/5   Biceps  5/5   5/5   Triceps  5/5   5/5   Infraspinatus 5/5  5/5  Medial pectoralis 5/5  5/5  Wrist extensors  5/5   5/5   Wrist flexors  5/5   5/5   Finger extensors  5/5   5/5   Finger flexors  5/5   5/5   Dorsal interossei  5/5   5/5   Abductor pollicis  5/5   5/5   Tone (Ashworth scale)  0  0   Lower Extremity:  Right  Left  Hip flexors  5/5   5/5   Hip extensors  5/5   5/5   Adductor 5/5  5/5  Abductor 5/5  5/5  Knee flexors  5/5   5/5   Knee extensors  5/5   5/5   Dorsiflexors  5/5   4+/5   Plantarflexors  5/5   5/5   Inversion 5/5  5/5  Eversion 5/5  4+/5  Toe extensors  5/5   4+/5   Toe flexors  5/5   5/5   Tone (Ashworth scale)  0  0   MSRs:  Right        Left                  brachioradialis 2+  2+  biceps 2+  2+  triceps 2+  2+  patellar 2+  2+  ankle jerk 2+  2+  Hoffman no  no  plantar response down  down   SENSORY:  Diminished pin prick and temperature over  the  dorsum of the left foot, intact in the lower leg and RLE.  COORDINATION/GAIT: Normal finger-to- nose-finger.  Intact rapid alternating movements bilaterally.  Able to rise from a chair without using arms.  Gait narrow based and stable. Tandem and stressed gait intact.     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  Weakness is slowly improving, but he continues to have paresthesia over the superficial peroneal distribution.  I explained that motor weakness usually recovers faster than sensory fibers, which can take much longer to appreciate improvement.   It's unclear exactly what triggered his neuropathy.  Risk factors include:  Diabetes and Crohn's disease. Autoimmune disease, like Crohn's disease, can certainly cause peripheral nerve entrapment syndromes, but he has not had any recent flare. I asked him to pay attention and avoid crossing his legs or observe if there is position that he works in that would compress the nerve at the lateral knee.  We discussed NCS/EMG to localize symptoms and assess prognosis, but given that he is already improving, there may be low-value on doing testing at this time.  Should symptoms get worse, he will let me know and I am happy to bring him back for testing.  In the meantime, he was encouraged to start home exercises to work on foot and toe extension and eversion  Return to clinic, if no improvement  Thank you for allowing me to participate in patient's care.  If I can answer any additional questions, I would be pleased to do so.    Sincerely,    Donnae Michels K. Posey Pronto, DO

## 2019-03-16 MED FILL — LIALDA 1.2 GM TABLET SA: 1.2 | 30 days supply | Qty: 120 | Fill #5

## 2019-03-29 MED FILL — TOUJEO SOLOSTAR 300 UNITS/M: 300 | 27 days supply | Qty: 5 | Fill #3

## 2019-03-31 DIAGNOSIS — H5213 Myopia, bilateral: Secondary | ICD-10-CM | POA: Diagnosis not present

## 2019-03-31 DIAGNOSIS — H52223 Regular astigmatism, bilateral: Secondary | ICD-10-CM | POA: Diagnosis not present

## 2019-04-13 ENCOUNTER — Other Ambulatory Visit (HOSPITAL_BASED_OUTPATIENT_CLINIC_OR_DEPARTMENT_OTHER): Payer: Self-pay | Admitting: Internal Medicine

## 2019-04-13 MED FILL — BASAGLAR 100 UNIT/ML KWIKPE: 100 | 90 days supply | Qty: 45 | Fill #0

## 2019-04-15 ENCOUNTER — Other Ambulatory Visit: Payer: Self-pay

## 2019-04-15 ENCOUNTER — Encounter (INDEPENDENT_AMBULATORY_CARE_PROVIDER_SITE_OTHER): Payer: Self-pay | Admitting: Internal Medicine

## 2019-04-15 ENCOUNTER — Ambulatory Visit (INDEPENDENT_AMBULATORY_CARE_PROVIDER_SITE_OTHER): Payer: 59 | Admitting: Internal Medicine

## 2019-04-15 VITALS — BP 120/85 | HR 82 | Temp 98.8°F | Ht 73.0 in | Wt 171.2 lb

## 2019-04-15 DIAGNOSIS — R5383 Other fatigue: Secondary | ICD-10-CM | POA: Diagnosis not present

## 2019-04-15 DIAGNOSIS — R7989 Other specified abnormal findings of blood chemistry: Secondary | ICD-10-CM | POA: Diagnosis not present

## 2019-04-15 DIAGNOSIS — E119 Type 2 diabetes mellitus without complications: Secondary | ICD-10-CM | POA: Diagnosis not present

## 2019-04-15 DIAGNOSIS — R5381 Other malaise: Secondary | ICD-10-CM

## 2019-04-15 NOTE — Progress Notes (Signed)
Metrics: Intervention Frequency ACO  Documented Smoking Status Yearly  Screened one or more times in 24 months  Cessation Counseling or  Active cessation medication Past 24 months  Past 24 months   Guideline developer: UpToDate (See UpToDate for funding source) Date Released: 2014       Wellness Office Visit  Subjective:  Patient ID: Kenneth [name], male    DOB: Mar 07, 1975  Age: 44 y.o. MRN: [id number]  CC: This 44 year old man comes to our practice as a new patient to establish care.  HPI  He has a history of ulcerative colitis and also diabetes which may be type II.  He is on insulin.  He has an endocrinologist that he sees. He describes decreased libido compared to several years ago. Past Medical History:  Diagnosis Date  . Colitis   . Crohn's disease ([place])   . Diabetes mellitus without complication ([place])   . Steroid-induced hyperglycemia       Family History  Problem Relation Age of Onset  . Heart disease Paternal Grandfather   . Heart attack Paternal Grandfather   . GER disease Father   . Hyperlipidemia Maternal Grandmother   . Stroke Maternal Grandmother   . Colon cancer Neg Hx   . Rectal cancer Neg Hx   . Stomach cancer Neg Hx   . Pancreatic cancer Neg Hx     Social History   Social History Narrative   Works as an Training and development officer (Conservation officer, [employer])   Married for 15 years.   No children   Chickens (egg laying)   Enjoys hiking, spending time with his wife, reading/games   Social History   Tobacco Use  . Smoking status: Former Smoker    Types: Cigarettes    Quit date: 07/01/2004    Years since quitting: 14.7  . Smokeless tobacco: Never Used  Substance Use Topics  . Alcohol use: Yes    Alcohol/week: 14.0 standard drinks    Types: 14 Glasses of wine per week    Current Meds  Medication Sig  . Insulin Glargine (TOUJEO SOLOSTAR [place]) Inject 19 Units into the skin daily.  . insulin lispro (HUMALOG KWIKPEN) 100 UNIT/ML KiwkPen Inject into the skin. Use  on a sliding scale before meals as needed.  . mesalamine (LIALDA) 1.2 g EC tablet TAKE 4 TABLETS BY MOUTH DAILY  . OVER THE COUNTER MEDICATION CBD Oil.  As needed daily.  . TURMERIC PO Take 3 g by mouth daily.     Objective:   Today's Vitals: BP 120/85 (BP Location: Left Arm, Patient Position: Sitting, Cuff Size: Normal)   Pulse 82   Temp 98.8 F (37.1 C) (Temporal)   Ht 6' 1"  (1.854 m)   Wt 171 lb 3.2 oz (77.7 kg)   SpO2 96%   BMI 22.59 kg/m  Vitals with BMI 04/15/2019 02/15/2019 12/01/2018  Height 6' 1"  6' 1"  6' 1"   Weight 171 lbs 3 oz 170 lbs 173 lbs  BMI 22.59 74.94 49.67  Systolic 591 638 466  Diastolic 85 90 80  Pulse 82 74 79     Physical Exam   He looks systemically well.  Alert and orientated.    Assessment   1. Diabetes mellitus without complication ([place])   2. Malaise and fatigue   3. Low testosterone in male       Tests ordered Orders Placed This Encounter  Procedures  . Lipid panel  . Hemoglobin A1c  . COMPLETE METABOLIC PANEL WITH GFR  . Testosterone Total,Free,Bio, Males  Plan: 1. Blood work is ordered above. 2. I will see him in the next several weeks to discuss all results and further recommendations. 3. Today I spent 30 minutes with this patient going through his medical records discussing the philosophy of practice.   No orders of the defined types were placed in this encounter.   Doree Albee, MD

## 2019-04-19 DIAGNOSIS — E349 Endocrine disorder, unspecified: Secondary | ICD-10-CM | POA: Diagnosis not present

## 2019-04-19 DIAGNOSIS — E099 Drug or chemical induced diabetes mellitus without complications: Secondary | ICD-10-CM | POA: Diagnosis not present

## 2019-04-19 DIAGNOSIS — Z794 Long term (current) use of insulin: Secondary | ICD-10-CM | POA: Diagnosis not present

## 2019-04-19 LAB — COMPLETE METABOLIC PANEL WITH GFR
AG Ratio: 1.4 (calc) (ref 1.0–2.5)
ALT: 18 U/L (ref 9–46)
AST: 25 U/L (ref 10–40)
Albumin: 4.3 g/dL (ref 3.6–5.1)
Alkaline phosphatase (APISO): 54 U/L (ref 36–130)
BUN: 15 mg/dL (ref 7–25)
CO2: 31 mmol/L (ref 20–32)
Calcium: 9.8 mg/dL (ref 8.6–10.3)
Chloride: 100 mmol/L (ref 98–110)
Creat: 0.78 mg/dL (ref 0.60–1.35)
GFR, Est African American: 129 mL/min/{1.73_m2} (ref >=60)
GFR, Est Non African American: 111 mL/min/{1.73_m2} (ref >=60)
Globulin: 3.1 g/dL (calc) (ref 1.9–3.7)
Glucose, Bld: 95 mg/dL (ref 65–99)
Potassium: 4.3 mmol/L (ref 3.5–5.3)
Sodium: 140 mmol/L (ref 135–146)
Total Bilirubin: 0.7 mg/dL (ref 0.2–1.2)
Total Protein: 7.4 g/dL (ref 6.1–8.1)

## 2019-04-19 LAB — LIPID PANEL
Cholesterol: 170 mg/dL (ref <200)
HDL: 51 mg/dL (ref >=40)
LDL Cholesterol (Calc): 100 mg/dL (calc) — ABNORMAL HIGH
Non-HDL Cholesterol (Calc): 119 mg/dL (calc) (ref <130)
Total CHOL/HDL Ratio: 3.3 (calc) (ref <5.0)
Triglycerides: 91 mg/dL (ref <150)

## 2019-04-19 LAB — HEMOGLOBIN A1C
Hgb A1c MFr Bld: 7.1 % of total Hgb — ABNORMAL HIGH (ref <5.7)
Mean Plasma Glucose: 157 (calc)
eAG (mmol/L): 8.7 (calc)

## 2019-04-19 LAB — TESTOSTERONE TOTAL,FREE,BIO, MALES
Albumin: 4.3 g/dL (ref 3.6–5.1)
Sex Hormone Binding: 55 nmol/L — ABNORMAL HIGH (ref 10–50)
Testosterone: 147 ng/dL — ABNORMAL LOW (ref 250–827)

## 2019-04-20 MED FILL — LIALDA 1.2 GM TABLET SA: 1.2 | 30 days supply | Qty: 120 | Fill #6

## 2019-04-21 ENCOUNTER — Encounter (INDEPENDENT_AMBULATORY_CARE_PROVIDER_SITE_OTHER): Payer: Self-pay | Admitting: Internal Medicine

## 2019-04-21 DIAGNOSIS — E349 Endocrine disorder, unspecified: Secondary | ICD-10-CM | POA: Diagnosis not present

## 2019-04-30 ENCOUNTER — Encounter: Payer: 59 | Admitting: Family

## 2019-05-19 ENCOUNTER — Other Ambulatory Visit: Payer: Self-pay

## 2019-05-19 ENCOUNTER — Ambulatory Visit (INDEPENDENT_AMBULATORY_CARE_PROVIDER_SITE_OTHER): Payer: 59 | Admitting: Internal Medicine

## 2019-05-19 ENCOUNTER — Encounter (INDEPENDENT_AMBULATORY_CARE_PROVIDER_SITE_OTHER): Payer: Self-pay | Admitting: Internal Medicine

## 2019-05-19 VITALS — BP 142/80 | HR 72 | Temp 97.9°F | Ht 73.0 in | Wt 170.0 lb

## 2019-05-19 DIAGNOSIS — R7989 Other specified abnormal findings of blood chemistry: Secondary | ICD-10-CM | POA: Diagnosis not present

## 2019-05-19 DIAGNOSIS — R6889 Other general symptoms and signs: Secondary | ICD-10-CM | POA: Diagnosis not present

## 2019-05-19 DIAGNOSIS — E099 Drug or chemical induced diabetes mellitus without complications: Secondary | ICD-10-CM

## 2019-05-19 MED ORDER — CLOMIPHENE CITRATE 50 MG PO TABS: 50.0000 mg | ORAL_TABLET | Freq: Every day | ORAL | 3 refills | Status: DC

## 2019-05-19 MED FILL — CLOMIPHENE CITRATE 50 MG TA: 50 | 30 days supply | Qty: 30 | Fill #0

## 2019-05-19 NOTE — Patient Instructions (Signed)
[name] Dietary Recommendations for Weight Loss What to Avoid . Avoid added sugars o Often added sugar can be found in processed foods such as many condiments, dry cereals, cakes, cookies, chips, crisps, crackers, candies, sweetened drinks, etc.  o Read labels and AVOID/DECREASE use of foods with the following in their ingredient list: Sugar, fructose, high fructose corn syrup, sucrose, glucose, maltose, dextrose, molasses, cane sugar, brown sugar, any type of syrup, agave nectar, etc.   . Avoid snacking in between meals . Avoid foods made with flour o If you are going to eat food made with flour, choose those made with whole-grains; and, minimize your consumption as much as is tolerable . Avoid processed foods o These foods are generally stocked in the middle of the grocery store. Focus on shopping on the perimeter of the grocery.  . Avoid Meat  o We recommend following a plant-based diet at [name]. Thus, we recommend avoiding meat as a general rule. Consider eating beans, legumes, eggs, and/or dairy products for regular protein sources o If you plan on eating meat limit to 4 ounces of meat at a time and choose lean options such as Fish, chicken, turkey. Avoid red meat intake such as pork and/or steak What to Include . Vegetables o GREEN LEAFY VEGETABLES: Kale, spinach, mustard greens, collard greens, cabbage, broccoli, etc. o OTHER: Asparagus, cauliflower, eggplant, carrots, peas, Brussel sprouts, tomatoes, bell peppers, zucchini, beets, cucumbers, etc. . Grains, seeds, and legumes o Beans: kidney beans, black eyed peas, garbanzo beans, black beans, pinto beans, etc. o Whole, unrefined grains: brown rice, barley, bulgur, oatmeal, etc. . Healthy fats  o Avoid highly processed fats such as vegetable oil o Examples of healthy fats: avocado, olives, virgin olive oil, dark chocolate (?72% Cocoa), nuts (peanuts, almonds, walnuts, cashews, pecans, etc.) . None to Low  Intake of Animal Sources of Protein o Meat sources: chicken, turkey, salmon, tuna. Limit to 4 ounces of meat at one time. o Consider limiting dairy sources, but when choosing dairy focus on: PLAIN Greek yogurt, cottage cheese, high-protein milk . Fruit o Choose berries  When to Eat . Intermittent Fasting: o Choosing not to eat for a specific time period, but DO FOCUS ON HYDRATION when fasting o Multiple Techniques: - Time Restricted Eating: eat 3 meals in a day, each meal lasting no more than 60 minutes, no snacks between meals - 16-18 hour fast: fast for 16 to 18 hours up to 7 days a week. Often suggested to start with 2-3 nonconsecutive days per week.  . Remember the time you sleep is counted as fasting.  . Examples of eating schedule: Fast from 7:00pm-11:00am. Eat between 11:00am-7:00pm.  - 24-hour fast: fast for 24 hours up to every other day. Often suggested to start with 1 day per week . Remember the time you sleep is counted as fasting . Examples of eating schedule:  o Eating day: eat 2-3 meals on your eating day. If doing 2 meals, each meal should last no more than 90 minutes. If doing 3 meals, each meal should last no more than 60 minutes. Finish last meal by 7:00pm. o Fasting day: Fast until 7:00pm.  o IF YOU FEEL UNWELL FOR ANY REASON/IN ANY WAY WHEN FASTING, STOP FASTING BY EATING A NUTRITIOUS SNACK OR LIGHT MEAL o ALWAYS FOCUS ON HYDRATION DURING FASTS - Acceptable Hydration sources: water, broths, tea/coffee (black tea/coffee is best but using a small amount of whole-fat dairy products in coffee/tea is acceptable).  -   Poor Hydration Sources: anything with sugar or artificial sweeteners added to it  These recommendations have been developed for patients that are actively receiving medical care from either Dr. Deanta [name] or Kenneth Gray, [name], [name] at [name]. These recommendations are developed for patients with specific medical conditions and are not meant to be  distributed or used by others that are not actively receiving care from either provider listed above at [name]. It is not appropriate to participate in the above eating plans without proper medical supervision.   Reference: Fung, J. The obesity code. [place]: [place]; 2016.   

## 2019-05-19 NOTE — Progress Notes (Signed)
Metrics: Intervention Frequency ACO  Documented Smoking Status Yearly  Screened one or more times in 24 months  Cessation Counseling or  Active cessation medication Past 24 months  Past 24 months   Guideline developer: UpToDate (See UpToDate for funding source) Date Released: 2014       Wellness Office Visit  Subjective:  Patient ID: Darrelle Wiberg, male    DOB: [birth year]  Age: 44 y.o. MRN: [id number]  CC: This man comes in to discuss all his blood results and further recommendations.  He has steroid-induced diabetes mellitus. HPI I discussed all his results with him.  His testosterone levels are very low.  He had testosterone levels drawn by his endocrinologist and morning testosterone levels is only 330 also. His hemoglobin A1c is 7.1%, not optimal.  He continues to take insulin. He is interested in becoming healthier and exercising optimally.  Past Medical History:  Diagnosis Date  . Colitis   . Crohn's disease ([place])   . Diabetes mellitus without complication ([place])   . Steroid-induced hyperglycemia       Family History  Problem Relation Age of Onset  . Heart disease Paternal Grandfather   . Heart attack Paternal Grandfather   . GER disease Father   . Hyperlipidemia Maternal Grandmother   . Stroke Maternal Grandmother   . Colon cancer Neg Hx   . Rectal cancer Neg Hx   . Stomach cancer Neg Hx   . Pancreatic cancer Neg Hx     Social History   Social History Narrative   Works as an Training and development officer (Conservation officer, [employer])   Married for 15 years.   No children   Chickens (egg laying)   Enjoys hiking, spending time with his wife, reading/games   Social History   Tobacco Use  . Smoking status: Former Smoker    Types: Cigarettes    Quit date: 07/01/2004    Years since quitting: 14.8  . Smokeless tobacco: Never Used  Substance Use Topics  . Alcohol use: Yes    Alcohol/week: 14.0 standard drinks    Types: 14 Glasses of wine per week    Current Meds  Medication  Sig  . Insulin Glargine (TOUJEO SOLOSTAR [place]) Inject 19 Units into the skin daily.  . insulin lispro (HUMALOG KWIKPEN) 100 UNIT/ML KiwkPen Inject into the skin. Use on a sliding scale before meals as needed.  . mesalamine (LIALDA) 1.2 g EC tablet TAKE 4 TABLETS BY MOUTH DAILY  . OVER THE COUNTER MEDICATION CBD Oil.  As needed daily.  . TURMERIC PO Take 3 g by mouth daily.      Objective:   Today's Vitals: BP (!) 142/80 (BP Location: Left Arm, Patient Position: Sitting, Cuff Size: Normal)   Pulse 72   Temp 97.9 F (36.6 C) (Temporal)   Ht 6' 1"  (1.854 m)   Wt 170 lb (77.1 kg)   SpO2 98%   BMI 22.43 kg/m  Vitals with BMI 05/19/2019 04/15/2019 02/15/2019  Height 6' 1"  6' 1"  6' 1"   Weight 170 lbs 171 lbs 3 oz 170 lbs  BMI 22.43 76.73 41.93  Systolic 790 240 973  Diastolic 80 85 90  Pulse 72 82 74     Physical Exam  He looks systemically well.  Weight is stable.  Blood pressure slightly elevated today.     Assessment   1. 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 Low testosterone in male   3. Exercise intolerance       Tests ordered No orders  of the defined types were placed in this encounter.    Plan: 1. As far as his low testosterone is concerned, I think he would benefit from improving the numbers as he does seem to have symptoms reflective of this.  He is not a candidate at the present time for testosterone therapy as he wishes to have children.  Therefore, I am going to start him on clomiphene to see if this will help him.  I explained how this will work and that his testosterone levels will improve but not too much higher levels as would be expected with using testosterone therapy itself. 2. As far as his diabetes is concerned we discussed nutrition and the concept of intermittent fasting together with a more plant-based diet.  He will try to see what he can do with this.  I would like him to see if we can reduce insulin completely remove it from his drug regimen. 3. I  will see him in about 6 weeks time to see how he is doing and we will review the VO2 max testing which he wants to do also. 4. Today I spent 40 minutes with patient discussing all his results and spent time discussing nutrition and exercise.   Meds ordered this encounter  Medications  . clomiPHENE (CLOMID) 50 MG tablet    Sig: Take 1 tablet (50 mg total) by mouth daily.    Dispense:  30 tablet    Refill:  3    Enes Rokosz Luther Parody, MD

## 2019-05-21 MED FILL — LIALDA 1.2 GM TABLET SA: 1.2 | 30 days supply | Qty: 120 | Fill #7

## 2019-05-26 ENCOUNTER — Telehealth (HOSPITAL_COMMUNITY): Payer: Self-pay | Admitting: *Deleted

## 2019-05-26 NOTE — Telephone Encounter (Signed)
Contacted patient per order for Cardiopulmonary Exercise Test. Patient unavailable, left voicemail for patient to return call at [phone] to schedule CPX.  Will continue to try to schedule patient.   Landis Martins, MS, ACSM-RCEP Clinical Exercise Physiologist     Landis Martins, MS, ACSM-RCEP Clinical Exercise Physiologist

## 2019-06-01 MED FILL — FREESTYLE LITE TEST STRIP: 90 days supply | Qty: 400 | Fill #2

## 2019-06-02 MED FILL — HUMALOG 100 UNITS/ML KWIKPE: 100 | 83 days supply | Qty: 30 | Fill #1

## 2019-06-09 DIAGNOSIS — Z20828 Contact with and (suspected) exposure to other viral communicable diseases: Secondary | ICD-10-CM | POA: Diagnosis not present

## 2019-06-09 DIAGNOSIS — U071 COVID-19: Secondary | ICD-10-CM | POA: Diagnosis not present

## 2019-06-09 DIAGNOSIS — Z03818 Encounter for observation for suspected exposure to other biological agents ruled out: Secondary | ICD-10-CM | POA: Diagnosis not present

## 2019-06-15 MED FILL — ULTICARE PEN NDL 4MM 32G: 32G X 4 MM | 30 days supply | Qty: 100 | Fill #1

## 2019-06-15 MED FILL — CLOMIPHENE CITRATE 50 MG TA: 50 | 30 days supply | Qty: 30 | Fill #1

## 2019-06-30 ENCOUNTER — Ambulatory Visit (INDEPENDENT_AMBULATORY_CARE_PROVIDER_SITE_OTHER): Payer: 59 | Admitting: Internal Medicine

## 2019-07-06 ENCOUNTER — Encounter (HOSPITAL_COMMUNITY): Payer: 59

## 2019-07-23 MED FILL — CLOMIPHENE CITRATE 50 MG TA: 50 | 30 days supply | Qty: 30 | Fill #2

## 2019-07-26 ENCOUNTER — Other Ambulatory Visit: Payer: Self-pay

## 2019-07-26 ENCOUNTER — Ambulatory Visit (INDEPENDENT_AMBULATORY_CARE_PROVIDER_SITE_OTHER): Payer: 59 | Admitting: Internal Medicine

## 2019-07-26 ENCOUNTER — Encounter (INDEPENDENT_AMBULATORY_CARE_PROVIDER_SITE_OTHER): Payer: Self-pay | Admitting: Internal Medicine

## 2019-07-26 VITALS — BP 140/85 | HR 80 | Temp 98.1°F | Ht 73.0 in | Wt 170.2 lb

## 2019-07-26 DIAGNOSIS — E119 Type 2 diabetes mellitus without complications: Secondary | ICD-10-CM

## 2019-07-26 DIAGNOSIS — R7989 Other specified abnormal findings of blood chemistry: Secondary | ICD-10-CM

## 2019-07-26 NOTE — Progress Notes (Signed)
Metrics: Intervention Frequency ACO  Documented Smoking Status Yearly  Screened one or more times in 24 months  Cessation Counseling or  Active cessation medication Past 24 months  Past 24 months   Guideline developer: UpToDate (See UpToDate for funding source) Date Released: 2014       Wellness Office Visit  Subjective:  Patient ID: Kenneth [name], male    DOB: [birth year]  Age: 44 y.o. MRN: [id number]  CC: This man comes in for follow-up of his insulin-dependent diabetes, low testosterone levels. HPI  He has been following intermittent fasting and a plant-based diet but not consistently.  He has noticed that his insulin requirements appear to be slightly less. He has tolerated taking clomiphene for low testosterone levels. Past Medical History:  Diagnosis Date  . Colitis   . Crohn's disease ([place])   . Diabetes mellitus without complication ([place])   . Steroid-induced hyperglycemia    Past Surgical History:  Procedure Laterality Date  . HERNIA REPAIR  2010   left  . left thumb surgery  2000  . WISDOM TOOTH EXTRACTION  11/2010     Family History  Problem Relation Age of Onset  . Heart disease Paternal Grandfather   . Heart attack Paternal Grandfather   . GER disease Father   . Hyperlipidemia Maternal Grandmother   . Stroke Maternal Grandmother   . Colon cancer Neg Hx   . Rectal cancer Neg Hx   . Stomach cancer Neg Hx   . Pancreatic cancer Neg Hx     Social History   Social History Narrative   Works as an Training and development officer (Conservation officer, [employer])   Married for 15 years.   No children   Chickens (egg laying)   Enjoys hiking, spending time with his wife, reading/games   Social History   Tobacco Use  . Smoking status: Former Smoker    Types: Cigarettes    Quit date: 07/01/2004    Years since quitting: 15.0  . Smokeless tobacco: Never Used  Substance Use Topics  . Alcohol use: Yes    Alcohol/week: 14.0 standard drinks    Types: 14 Glasses of wine per week     Current Meds  Medication Sig  . clomiPHENE (CLOMID) 50 MG tablet Take 1 tablet (50 mg total) by mouth daily.  . Insulin Glargine (TOUJEO SOLOSTAR [place]) Inject 19 Units into the skin daily.  . insulin lispro (HUMALOG KWIKPEN) 100 UNIT/ML KiwkPen Inject into the skin. Use on a sliding scale before meals as needed.  . mesalamine (LIALDA) 1.2 g EC tablet TAKE 4 TABLETS BY MOUTH DAILY  . OVER THE COUNTER MEDICATION CBD Oil.  As needed daily.  . TURMERIC PO Take 3 g by mouth daily.     Depression screen [facility] 2/9 04/15/2019 06/02/2017  Decreased Interest 0 0  Down, Depressed, Hopeless 0 0  PHQ - 2 Score 0 0  Altered sleeping - 1  Tired, decreased energy - 1  Change in appetite - 0  Feeling bad or failure about yourself  - 0  Trouble concentrating - 0  Moving slowly or fidgety/restless - 0  Suicidal thoughts - 0  PHQ-9 Score - 2  Difficult doing work/chores - Not difficult at all     Objective:   Today's Vitals: BP 140/85 (BP Location: Left Arm, Patient Position: Sitting, Cuff Size: Normal)   Pulse 80   Temp 98.1 F (36.7 C) (Temporal)   Ht 6' 1"  (1.854 m)   Wt 170 lb 3.2 oz (77.2 kg)  SpO2 98%   BMI 22.46 kg/m  Vitals with BMI 07/26/2019 05/19/2019 04/15/2019  Height 6' 1"  6' 1"  6' 1"   Weight 170 lbs 3 oz 170 lbs 171 lbs 3 oz  BMI 22.46 25.00 37.04  Systolic 888 916 945  Diastolic 85 80 85  Pulse 80 72 82     Physical Exam   He looks systemically well.  Blood pressure slightly elevated today. I performed a body composition scan on him and he has 20% body fat with about 57% total body water and muscle mass that is suboptimal, especially in his legs.    Assessment   1. Low testosterone in male   2. Diabetes mellitus without complication ([place])       Tests ordered No orders of the defined types were placed in this encounter.    Plan: 1. He will continue with clomiphene for his low testosterone levels and we will check these levels the next time. 2. As far as his  diabetes is concerned, I have encouraged him to can be consistent with intermittent fasting combined with a plant-based diet and he will try to do so now. 3. Follow-up in about 6 weeks when we should check all the blood work.   No orders of the defined types were placed in this encounter.   Doree Albee, MD

## 2019-08-03 MED FILL — LIALDA 1.2 GM TABLET SA: 1.2 | 30 days supply | Qty: 120 | Fill #9

## 2019-08-05 DIAGNOSIS — E1165 Type 2 diabetes mellitus with hyperglycemia: Secondary | ICD-10-CM | POA: Diagnosis not present

## 2019-08-05 DIAGNOSIS — E099 Drug or chemical induced diabetes mellitus without complications: Secondary | ICD-10-CM | POA: Diagnosis not present

## 2019-08-05 DIAGNOSIS — Z794 Long term (current) use of insulin: Secondary | ICD-10-CM | POA: Diagnosis not present

## 2019-08-06 ENCOUNTER — Other Ambulatory Visit (HOSPITAL_BASED_OUTPATIENT_CLINIC_OR_DEPARTMENT_OTHER): Payer: Self-pay | Admitting: Internal Medicine

## 2019-08-06 MED FILL — DEXCOM G6 TRANSMITTER MISC: 90 days supply | Qty: 1 | Fill #0

## 2019-08-06 MED FILL — DEXCOM G6 SENSOR MISC: 30 days supply | Qty: 3 | Fill #0

## 2019-08-06 MED FILL — DEXCOM G6 RECEIVER DEVI: 90 days supply | Qty: 1 | Fill #0

## 2019-08-17 MED FILL — HUMALOG 100 UNITS/ML KWIKPE: 100 | 83 days supply | Qty: 30 | Fill #2

## 2019-08-17 MED FILL — DEXCOM G6 SENSOR MISC: 30 days supply | Qty: 3 | Fill #0

## 2019-08-17 MED FILL — DEXCOM G6 TRANSMITTER MISC: 90 days supply | Qty: 1 | Fill #0

## 2019-08-17 MED FILL — DEXCOM G6 RECEIVER DEVI: 90 days supply | Qty: 1 | Fill #0

## 2019-08-25 MED FILL — ULTICARE PEN NDL 4MM 32G: 32G X 4 MM | 30 days supply | Qty: 100 | Fill #2

## 2019-09-06 MED FILL — LIALDA 1.2 GM TABLET SA: 1.2 | 30 days supply | Qty: 120 | Fill #10

## 2019-09-06 MED FILL — CLOMIPHENE CITRATE 50 MG TA: 50 | 30 days supply | Qty: 30 | Fill #3

## 2019-09-08 MED FILL — DEXCOM G6 SENSOR MISC: 30 days supply | Qty: 3 | Fill #0

## 2019-09-08 MED FILL — DEXCOM G6 TRANSMITTER MISC: 90 days supply | Qty: 1 | Fill #0

## 2019-09-08 MED FILL — DEXCOM G6 RECEIVER DEVI: 90 days supply | Qty: 1 | Fill #0

## 2019-09-15 ENCOUNTER — Ambulatory Visit (INDEPENDENT_AMBULATORY_CARE_PROVIDER_SITE_OTHER): Payer: 59 | Admitting: Internal Medicine

## 2019-09-17 DIAGNOSIS — E099 Drug or chemical induced diabetes mellitus without complications: Secondary | ICD-10-CM | POA: Diagnosis not present

## 2019-09-17 DIAGNOSIS — Z794 Long term (current) use of insulin: Secondary | ICD-10-CM | POA: Diagnosis not present

## 2019-09-30 ENCOUNTER — Encounter (INDEPENDENT_AMBULATORY_CARE_PROVIDER_SITE_OTHER): Payer: Self-pay | Admitting: Nurse Practitioner

## 2019-09-30 ENCOUNTER — Encounter (INDEPENDENT_AMBULATORY_CARE_PROVIDER_SITE_OTHER): Payer: Self-pay | Admitting: Internal Medicine

## 2019-09-30 ENCOUNTER — Other Ambulatory Visit (INDEPENDENT_AMBULATORY_CARE_PROVIDER_SITE_OTHER): Payer: Self-pay | Admitting: Nurse Practitioner

## 2019-10-07 ENCOUNTER — Other Ambulatory Visit: Payer: Self-pay | Admitting: Gastroenterology

## 2019-10-07 MED FILL — LIALDA 1.2 GM TABLET SA: 1.2 | 30 days supply | Qty: 120 | Fill #0

## 2019-10-12 DIAGNOSIS — H01004 Unspecified blepharitis left upper eyelid: Secondary | ICD-10-CM | POA: Diagnosis not present

## 2019-10-12 DIAGNOSIS — H01001 Unspecified blepharitis right upper eyelid: Secondary | ICD-10-CM | POA: Diagnosis not present

## 2019-10-12 DIAGNOSIS — E119 Type 2 diabetes mellitus without complications: Secondary | ICD-10-CM | POA: Diagnosis not present

## 2019-10-12 DIAGNOSIS — D3131 Benign neoplasm of right choroid: Secondary | ICD-10-CM | POA: Diagnosis not present

## 2019-10-14 ENCOUNTER — Other Ambulatory Visit (HOSPITAL_BASED_OUTPATIENT_CLINIC_OR_DEPARTMENT_OTHER): Payer: Self-pay | Admitting: Internal Medicine

## 2019-10-14 MED FILL — ULTICARE PEN NDL 4MM 32G: 32G X 4 MM | 30 days supply | Qty: 100 | Fill #0

## 2019-10-15 ENCOUNTER — Other Ambulatory Visit (HOSPITAL_BASED_OUTPATIENT_CLINIC_OR_DEPARTMENT_OTHER): Payer: Self-pay | Admitting: Internal Medicine

## 2019-10-15 MED FILL — HUMALOG 100 UNITS/ML KWIKPE: 100 | 81 days supply | Qty: 18 | Fill #0

## 2019-10-18 MED FILL — DEXCOM G6 SENSOR MISC: 30 days supply | Qty: 3 | Fill #1

## 2019-11-11 ENCOUNTER — Ambulatory Visit (INDEPENDENT_AMBULATORY_CARE_PROVIDER_SITE_OTHER): Payer: 59 | Admitting: Internal Medicine

## 2019-11-12 MED FILL — LIALDA 1.2 GM TABLET SA: 1.2 | 30 days supply | Qty: 120 | Fill #1

## 2019-11-25 MED FILL — DEXCOM G6 SENSOR MISC: 30 days supply | Qty: 3 | Fill #2

## 2019-11-29 ENCOUNTER — Ambulatory Visit (INDEPENDENT_AMBULATORY_CARE_PROVIDER_SITE_OTHER): Payer: 59 | Admitting: Internal Medicine

## 2019-12-13 ENCOUNTER — Other Ambulatory Visit: Payer: Self-pay | Admitting: Gastroenterology

## 2019-12-13 MED FILL — LIALDA 1.2 GM TABLET SA: 1.2 | 30 days supply | Qty: 120 | Fill #0

## 2019-12-28 MED FILL — DEXCOM G6 SENSOR MISC: 30 days supply | Qty: 3 | Fill #3

## 2019-12-28 MED FILL — HUMALOG 100 UNITS/ML KWIKPE: 100 | 81 days supply | Qty: 18 | Fill #1

## 2019-12-28 MED FILL — DEXCOM G6 TRANSMITTER MISC: 90 days supply | Qty: 1 | Fill #1

## 2019-12-28 MED FILL — ULTICARE PEN NDL 4MM 32G: 32G X 4 MM | 30 days supply | Qty: 100 | Fill #1

## 2020-01-04 ENCOUNTER — Encounter (INDEPENDENT_AMBULATORY_CARE_PROVIDER_SITE_OTHER): Payer: Self-pay | Admitting: Internal Medicine

## 2020-01-04 ENCOUNTER — Ambulatory Visit (INDEPENDENT_AMBULATORY_CARE_PROVIDER_SITE_OTHER): Payer: 59 | Admitting: Internal Medicine

## 2020-01-04 ENCOUNTER — Other Ambulatory Visit: Payer: Self-pay

## 2020-01-04 VITALS — BP 132/80 | HR 88 | Temp 97.5°F | Resp 18 | Ht 73.0 in | Wt 178.0 lb

## 2020-01-04 DIAGNOSIS — E119 Type 2 diabetes mellitus without complications: Secondary | ICD-10-CM

## 2020-01-04 DIAGNOSIS — R7989 Other specified abnormal findings of blood chemistry: Secondary | ICD-10-CM | POA: Diagnosis not present

## 2020-01-04 NOTE — Patient Instructions (Signed)
TRULICITY

## 2020-01-04 NOTE — Progress Notes (Signed)
Metrics: Intervention Frequency ACO  Documented Smoking Status Yearly  Screened one or more times in 24 months  Cessation Counseling or  Active cessation medication Past 24 months  Past 24 months   Guideline developer: UpToDate (See UpToDate for funding source) Date Released: 2014       Wellness Office Visit  Subjective:  Patient ID: Kenneth [name], male    DOB: August 06, 1975  Age: 44 y.o. MRN: [id number]  CC: This man comes in for follow-up of diabetes and hypogonadism. HPI Unfortunately he did not tolerate clomiphene as it gave him some visual side effects which have improved but still present.  He did go and see an eye doctor and there were no physical eye problems visualized. He continues to take insulin for his diabetes and now is on 15 units daily.  His sugars still tend to fluctuate.  Past Medical History:  Diagnosis Date  . Colitis   . Crohn's disease ([place])   . Diabetes mellitus without complication ([place])   . Steroid-induced hyperglycemia    Past Surgical History:  Procedure Laterality Date  . HERNIA REPAIR  2010   left  . left thumb surgery  2000  . WISDOM TOOTH EXTRACTION  11/2010     Family History  Problem Relation Age of Onset  . Heart disease Paternal Grandfather   . Heart attack Paternal Grandfather   . GER disease Father   . Hyperlipidemia Maternal Grandmother   . Stroke Maternal Grandmother   . Colon cancer Neg Hx   . Rectal cancer Neg Hx   . Stomach cancer Neg Hx   . Pancreatic cancer Neg Hx     Social History   Social History Narrative   Works as an Training and development officer (Conservation officer, [employer])   Married for 15 years.   No children   Chickens (egg laying)   Enjoys hiking, spending time with his wife, reading/games   Social History   Tobacco Use  . Smoking status: Former Smoker    Types: Cigarettes    Quit date: 07/01/2004    Years since quitting: 15.5  . Smokeless tobacco: Never Used  Substance Use Topics  . Alcohol use: Yes    Alcohol/week:  14.0 standard drinks    Types: 14 Glasses of wine per week    Current Meds  Medication Sig  . Insulin Glargine (TOUJEO SOLOSTAR [place]) Inject 14 Units into the skin daily.  . insulin lispro (HUMALOG) 100 UNIT/ML KiwkPen Inject into the skin. Use on a sliding scale before meals as needed.  . mesalamine (LIALDA) 1.2 g EC tablet TAKE 4 TABLETS BY MOUTH DAILY **NEEDS FOLLOW-UP VISIT. PLEASE CALL [phone] TO SCHEDULE APPOINTMENT**  . OVER THE COUNTER MEDICATION CBD Oil.  As needed daily.  . TURMERIC PO Take 3 g by mouth daily.      Depression screen [facility] 2/9 04/15/2019 06/02/2017  Decreased Interest 0 0  Down, Depressed, Hopeless 0 0  PHQ - 2 Score 0 0  Altered sleeping - 1  Tired, decreased energy - 1  Change in appetite - 0  Feeling bad or failure about yourself  - 0  Trouble concentrating - 0  Moving slowly or fidgety/restless - 0  Suicidal thoughts - 0  PHQ-9 Score - 2  Difficult doing work/chores - Not difficult at all     Objective:   Today's Vitals: BP 132/80 (BP Location: Left Arm, Patient Position: Sitting, Cuff Size: Normal)   Pulse 88   Temp (!) 97.5 F (36.4 C) (Temporal)  Resp 18   Ht 6' 1"  (1.854 m)   Wt 178 lb (80.7 kg)   SpO2 96%   BMI 23.48 kg/m  Vitals with BMI 01/04/2020 07/26/2019 05/19/2019  Height 6' 1"  6' 1"  6' 1"   Weight 178 lbs 170 lbs 3 oz 170 lbs  BMI 23.49 03.50 09.38  Systolic 182 993 716  Diastolic 80 85 80  Pulse 88 80 72     Physical Exam   He looks systemically well.  He has gained 8 pounds since last time I saw him.  Blood pressure better.    Assessment   1. Diabetes mellitus without complication ([place])   2. Low testosterone in male       Tests ordered No orders of the defined types were placed in this encounter.    Plan: 1. In terms of his diabetes, I think we need to discontinue insulin as he is not a type I diabetic and chronic insulin usage will put him at high risk of coronary artery disease.  I explained this to  him.  I suggested GLP-1 agonist such as Trulicity and he will think about it. 2. In terms of his testosterone, there is a possibility that he may still want to have children so therefore testosterone itself is not an option at the present time and we have discussed this before.  Therefore, I mentioned the possibility of using hCG to improve testosterone levels and I will investigate the cost and let him know. 3. Follow-up in about 2 months.  Patient declined COVID-19 vaccination and I have discussed this with him previously.   No orders of the defined types were placed in this encounter.   Doree Albee, MD

## 2020-01-05 ENCOUNTER — Encounter (INDEPENDENT_AMBULATORY_CARE_PROVIDER_SITE_OTHER): Payer: Self-pay | Admitting: Internal Medicine

## 2020-01-18 MED FILL — LIALDA 1.2 GM TABLET SA: 1.2 | 30 days supply | Qty: 120 | Fill #1

## 2020-01-27 MED FILL — DEXCOM G6 SENSOR MISC: 30 days supply | Qty: 3 | Fill #4

## 2020-02-09 ENCOUNTER — Ambulatory Visit: Payer: 59 | Admitting: Gastroenterology

## 2020-02-21 ENCOUNTER — Other Ambulatory Visit (HOSPITAL_BASED_OUTPATIENT_CLINIC_OR_DEPARTMENT_OTHER): Payer: Self-pay | Admitting: Internal Medicine

## 2020-02-23 ENCOUNTER — Other Ambulatory Visit: Payer: Self-pay | Admitting: Gastroenterology

## 2020-02-23 MED FILL — FREESTYLE LITE TEST STRIP: 90 days supply | Qty: 400 | Fill #0

## 2020-02-23 MED FILL — LIALDA 1.2 GM TABLET SA: 1.2 | 30 days supply | Qty: 120 | Fill #0

## 2020-02-28 DIAGNOSIS — E1165 Type 2 diabetes mellitus with hyperglycemia: Secondary | ICD-10-CM | POA: Diagnosis not present

## 2020-02-28 DIAGNOSIS — Z794 Long term (current) use of insulin: Secondary | ICD-10-CM | POA: Diagnosis not present

## 2020-02-28 DIAGNOSIS — E099 Drug or chemical induced diabetes mellitus without complications: Secondary | ICD-10-CM | POA: Diagnosis not present

## 2020-02-29 MED FILL — DEXCOM G6 SENSOR MISC: 30 days supply | Qty: 3 | Fill #5

## 2020-03-09 ENCOUNTER — Other Ambulatory Visit: Payer: Self-pay

## 2020-03-09 ENCOUNTER — Ambulatory Visit (INDEPENDENT_AMBULATORY_CARE_PROVIDER_SITE_OTHER): Payer: 59 | Admitting: Internal Medicine

## 2020-03-09 ENCOUNTER — Encounter (INDEPENDENT_AMBULATORY_CARE_PROVIDER_SITE_OTHER): Payer: Self-pay | Admitting: Internal Medicine

## 2020-03-09 ENCOUNTER — Other Ambulatory Visit (INDEPENDENT_AMBULATORY_CARE_PROVIDER_SITE_OTHER): Payer: Self-pay | Admitting: Internal Medicine

## 2020-03-09 VITALS — BP 144/90 | HR 77 | Temp 97.7°F | Resp 18 | Ht 73.0 in | Wt 178.5 lb

## 2020-03-09 DIAGNOSIS — E119 Type 2 diabetes mellitus without complications: Secondary | ICD-10-CM

## 2020-03-09 DIAGNOSIS — R7989 Other specified abnormal findings of blood chemistry: Secondary | ICD-10-CM | POA: Diagnosis not present

## 2020-03-09 NOTE — Progress Notes (Signed)
Metrics: Intervention Frequency ACO  Documented Smoking Status Yearly  Screened one or more times in 24 months  Cessation Counseling or  Active cessation medication Past 24 months  Past 24 months   Guideline developer: UpToDate (See UpToDate for funding source) Date Released: 2014       Wellness Office Visit  Subjective:  Patient ID: Kenneth [name], male    DOB: [birth year]  Age: 45 y.o. MRN: [id number]  CC: This man comes in for follow-up of diabetes, low testosterone level. HPI  He has seen his endocrinologist, Dr. Buddy Duty 10 days ago and further blood work was ordered and he will follow-up with Dr. Buddy Duty. We had discussed the use of hCG subcutaneously to improve testosterone levels but this was cost prohibitive. Otherwise, he continues to try and eat healthy and exercise. Past Medical History:  Diagnosis Date   Colitis    Crohn's disease ([place])    Diabetes mellitus without complication ([place])    Steroid-induced hyperglycemia    Past Surgical History:  Procedure Laterality Date   HERNIA REPAIR  2010   left   left thumb surgery  2000   WISDOM TOOTH EXTRACTION  11/2010     Family History  Problem Relation Age of Onset   Heart disease Paternal Grandfather    Heart attack Paternal Grandfather    GER disease Father    Hyperlipidemia Maternal Grandmother    Stroke Maternal Grandmother    Colon cancer Neg Hx    Rectal cancer Neg Hx    Stomach cancer Neg Hx    Pancreatic cancer Neg Hx     Social History   Social History Narrative   Works as an Training and development officer (Conservation officer, [employer])   Married for 15 years.   No children   Chickens (egg laying)   Enjoys hiking, spending time with his wife, reading/games   Social History   Tobacco Use   Smoking status: Former Smoker    Types: Cigarettes    Quit date: 07/01/2004    Years since quitting: 15.6   Smokeless tobacco: Never Used  Substance Use Topics   Alcohol use: Yes    Alcohol/week: 14.0 standard drinks     Types: 14 Glasses of wine per week    Current Meds  Medication Sig   glucose blood test strip CHECK BLOOD SUGAR IN VITRO FOUR TIMES A DAY   Insulin Glargine (TOUJEO SOLOSTAR [place]) Inject 14 Units into the skin daily.   insulin lispro (HUMALOG) 100 UNIT/ML KiwkPen Inject into the skin. Use on a sliding scale before meals as needed.   Insulin Syringe-Needle U-100 30G X 5/16" 0.3 ML MISC USE 4 TIMES A DAY WITH NOVOLOG AND LANTUS   Isopropyl Alcohol (ALCOHOL WIPES) 70 % MISC See admin instructions.   mesalamine (LIALDA) 1.2 g EC tablet TAKE 4 TABLETS BY MOUTH DAILY   OVER THE COUNTER MEDICATION CBD Oil.  As needed daily.   TURMERIC PO Take 3 g by mouth daily.     [place] Office Visit from 06/02/2017 in [place] at [facility]  PHQ-9 Total Score 2      Objective:   Today's Vitals: BP (!) 144/90    Pulse 77    Temp 97.7 F (36.5 C) (Temporal)    Resp 18    Ht 6' 1"  (1.854 m)    Wt 178 lb 8 oz (81 kg)    SpO2 98%    BMI 23.55 kg/m  Vitals with BMI 03/09/2020 01/04/2020 07/26/2019  Height 6' 1"  6' 1"  6' 1"   Weight 178 lbs 8 oz 178 lbs 170 lbs 3 oz  BMI 23.56 26.83 41.96  Systolic 222 979 892  Diastolic 90 80 85  Pulse 77 88 80     Physical Exam  He looks systemically well.  Healthy weight.  Blood pressure somewhat elevated today but not usual for him.     Assessment   1. Low testosterone in male   2. Diabetes mellitus without complication ([place])       Tests ordered No orders of the defined types were placed in this encounter.    Plan: 1. Continue with current therapy for his diabetes. 2. No specific treatment for low testosterone at the present time.  Testosterone therapy would be contraindicated as he and his wife may want children.  hCG is too expensive.  He has tried clomiphene but has not tolerated it. 3. He will follow-up with Judson Roch in about 3 months for an annual physical exam.   No orders of the defined types were  placed in this encounter.   Doree Albee, MD

## 2020-03-10 LAB — MICROALBUMIN, URINE: Microalb, Ur: 1.5 mg/dL

## 2020-03-23 MED FILL — ULTICARE PEN NDL 4MM 32G: 32G X 4 MM | 30 days supply | Qty: 100 | Fill #2

## 2020-03-23 MED FILL — BASAGLAR 100 UNIT/ML KWIKPE: 100 | 90 days supply | Qty: 45 | Fill #1

## 2020-03-23 MED FILL — HUMALOG 100 UNITS/ML KWIKPE: 100 | 81 days supply | Qty: 18 | Fill #2

## 2020-04-05 ENCOUNTER — Ambulatory Visit: Payer: 59 | Admitting: Gastroenterology

## 2020-04-18 ENCOUNTER — Other Ambulatory Visit (HOSPITAL_BASED_OUTPATIENT_CLINIC_OR_DEPARTMENT_OTHER): Payer: Self-pay

## 2020-04-18 MED FILL — Continuous Glucose System Transmitter: 90 days supply | Qty: 1 | Fill #0 | Status: AC

## 2020-05-01 ENCOUNTER — Other Ambulatory Visit (HOSPITAL_BASED_OUTPATIENT_CLINIC_OR_DEPARTMENT_OTHER): Payer: Self-pay

## 2020-05-01 ENCOUNTER — Other Ambulatory Visit: Payer: Self-pay

## 2020-05-01 MED ORDER — PREDNISONE 10 MG PO TABS
40.0000 mg | ORAL_TABLET | Freq: Every day | ORAL | 0 refills | Status: DC
Start: 2020-05-01 — End: 2020-05-31
  Filled 2020-05-01: qty 100, 25d supply, fill #0

## 2020-05-02 ENCOUNTER — Other Ambulatory Visit (HOSPITAL_BASED_OUTPATIENT_CLINIC_OR_DEPARTMENT_OTHER): Payer: Self-pay

## 2020-05-02 MED ORDER — BASAGLAR KWIKPEN 100 UNIT/ML ~~LOC~~ SOPN
PEN_INJECTOR | SUBCUTANEOUS | 11 refills | Status: DC
  Filled 2020-05-02: qty 45, 90d supply, fill #0

## 2020-05-02 MED ORDER — INSULIN LISPRO (1 UNIT DIAL) 100 UNIT/ML (KWIKPEN)
PEN_INJECTOR | SUBCUTANEOUS | 11 refills | Status: DC
  Filled 2020-05-02: qty 45, 84d supply, fill #0

## 2020-05-03 ENCOUNTER — Other Ambulatory Visit: Payer: Self-pay | Admitting: Gastroenterology

## 2020-05-03 ENCOUNTER — Other Ambulatory Visit (HOSPITAL_BASED_OUTPATIENT_CLINIC_OR_DEPARTMENT_OTHER): Payer: Self-pay

## 2020-05-03 MED ORDER — MESALAMINE 1.2 G PO TBEC
DELAYED_RELEASE_TABLET | Freq: Every day | ORAL | 1 refills | Status: DC
  Filled 2020-05-03: qty 120, 30d supply, fill #0
  Filled 2020-06-07: qty 120, 30d supply, fill #1

## 2020-05-17 ENCOUNTER — Other Ambulatory Visit (HOSPITAL_BASED_OUTPATIENT_CLINIC_OR_DEPARTMENT_OTHER): Payer: Self-pay

## 2020-05-17 MED FILL — Insulin Pen Needle 32 G X 4 MM (1/6" or 5/32"): 30 days supply | Qty: 100 | Fill #0 | Status: AC

## 2020-05-17 MED FILL — Insulin Pen Needle 32 G X 4 MM (1/6" or 5/32"): 30 days supply | Qty: 100 | Fill #0 | Status: CN

## 2020-05-17 MED FILL — Continuous Glucose System Sensor: 30 days supply | Qty: 3 | Fill #0 | Status: AC

## 2020-05-31 ENCOUNTER — Ambulatory Visit: Payer: 59 | Admitting: Gastroenterology

## 2020-05-31 ENCOUNTER — Encounter: Payer: Self-pay | Admitting: Gastroenterology

## 2020-05-31 DIAGNOSIS — K51919 Ulcerative colitis, unspecified with unspecified complications: Secondary | ICD-10-CM | POA: Diagnosis not present

## 2020-05-31 NOTE — Patient Instructions (Signed)
Continue Lialda taking 4 tablets by mouth daily.   You will be due for a recall colonoscopy in 11/2020. We will send you a reminder in the mail when it gets closer to that time.  Please follow up with Dr. Ardis Hughs in one year.

## 2020-05-31 NOTE — Progress Notes (Signed)
Review of pertinent gastrointestinal problems:  1. Left sided UC: Colonoscopy Dr. Penelope Coop 2006up to 30cm. Mesalamine products did not seem to help very well. Significant flare summer 2009 steroids started. Had extreme hyperglycemia on steroids requiring insulin. Repeat colonoscopy December, 2009 showed inflammation moderate To 70 cm.He was started on sulfasalazine. Had drug rash, likely pneumonitis, LFT abnormalities ( transaminases up to almost 1000, bili up to 6). Holding his medicines resulted in complete resolution of abnormal labs checked serially. TPMT testing January, 2010 showed normal enzyme activity. February, 2010 tapered off all medicines and feels very well. May, 2010: flaring of symptoms for the past month, He went to see a [place] herbalist, did not start the oral mesalamine that I recommended, restarted prednisone. September, 2010 self-medicating with prednisone however tapering off. January, 2011, : doing well off traditional IBD medicines, taking Chinese Herb. 05/2011 severe flare, (abd pain, weight loss, bloody diarrhea Hb 6s), required admission, iv steroids, blood transfusion. October 2014: flare of symptoms (had stopped mesalamine 4 months prior); 01/2013 flare improved with prednisone and restarting lialda. Flared 06/2014, prednisone 74m BID. OV 10/2016 doing well on full dose lialda.  Colonoscopy November 2019 for chronic colitis showed normal terminal ileum, colonic mucosa was abnormal throughout.  To have mild active inflammation in the rectosigmoid.  And burned-out inflammation throughout remaining segments.  Multiple pseudopolyps throughout.  Colon was biopsied extensively.  Biopsies showed no sign of dysplasia and no active or chronic inflammation.  I recommended recall at 3 years 2. swollen, somewhat thrombosed external hemorrhoid July 2013   HPI: This is a verbal a 45year old man  I last saw SKennieabout a year and a half ago, November 2020.  He was doing quite well on Lialda 4  pills once daily. They were moving to a new house with 8 acres and a new art workshop.  [place] with some flare symptoms from his colitis.  I called in a tapering regimen of prednisone.  He took the prednisone for 3 or 4-week tapering regimen and his flare symptoms responded quite quickly.  His flare symptoms were minor urgency and some blood in his stool, going 3 or 4 times daily.  He was also heading out on a long road trip.  Overall though he feels very well he has 2-3 soft generally well formed bowel movements that are nonbloody daily.  No abdominal pains.  He takes his Lialda 4 pills once daily.  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  They live on 8 or 9 acres.  Bought a tractor recently.  [place]   ROS: complete GI ROS as described in HPI, all other review negative.  Constitutional:  No unintentional weight loss   Past Medical History:  Diagnosis Date  . Colitis   . Crohn's disease ([place]   . Diabetes mellitus without complication ([place]   . Steroid-induced hyperglycemia     Past Surgical History:  Procedure Laterality Date  . HERNIA REPAIR  2010   left  . left thumb surgery  2000  . WISDOM TOOTH EXTRACTION  11/2010    Current Outpatient Medications  Medication Sig Dispense Refill  . Continuous Blood Gluc Sensor (DEXCOM G6 SENSOR) MISC USE AS DIRECTED AND CHANGE SENSOR EVERY 10 DAYS 3 each 11  . Continuous Blood  Gluc Transmit (DEXCOM G6 TRANSMITTER) MISC USE AS DIRECTED TO MONITOR BLOOD SUGARS 1 each 3  . glucose blood test strip USE TO CHECK BLOOD SUGAR 4 TIMES DAILY (Patient taking differently: USE TO CHECK BLOOD SUGAR 4 TIMES DAILY) 400 strip 2  . insulin lispro (HUMALOG) 100 UNIT/ML KwikPen INJECT 4 OR 5 UNITS AT BREAKFAST, 7 OR 8 UNITS AT LUNCHES, 7 TO 9 UNITS AT EVENING  MEAL 3 TIMES A DAY 30 mL 3  . Insulin Pen Needle 32G X 4 MM MISC USE AS DIRECTED TO ADMINSTER INSULIN THREE TIMES A DAY 100 each 11  . Insulin Syringe-Needle U-100 30G X 5/16" 0.3 ML MISC USE 4 TIMES A DAY WITH NOVOLOG AND LANTUS    . Isopropyl Alcohol (ALCOHOL WIPES) 70 % MISC See admin instructions.    . mesalamine (LIALDA) 1.2 g EC tablet TAKE 4 TABLETS BY MOUTH DAILY 120 tablet 1  . OVER THE COUNTER MEDICATION CBD Oil.  As needed daily.    . TURMERIC PO Take 3 g by mouth daily.    . Insulin Glargine (BASAGLAR KWIKPEN) 100 UNIT/ML INJECT 23 UNITS UNDER THE SKIN WITH MAX DAILY DOSE OF 50 U AS DIRECTED DAILY 90 DAYS 45 mL 3   No current facility-administered medications for this visit.    Allergies as of 05/31/2020 - Review Complete 05/31/2020  Allergen Reaction Noted  . Clomiphene  09/30/2019  . Moxifloxacin Other (See Comments)   . Sulfonamide derivatives Other (See Comments)     Family History  Problem Relation Age of Onset  . Heart disease Paternal Grandfather   . Heart attack Paternal Grandfather   . GER disease Father   . Hyperlipidemia Maternal Grandmother   . Stroke Maternal Grandmother   . Colon cancer Neg Hx   . Rectal cancer Neg Hx   . Stomach cancer Neg Hx   . Pancreatic cancer Neg Hx     Social History   Socioeconomic History  . Marital status: Married    Spouse name: Not on file  . Number of children: 0  . Years of education: Not on file  . Highest education level: Not on file  Occupational History  . Occupation: Training and development officer  Tobacco Use  . Smoking status: Former Smoker    Types: Cigarettes    Quit date: 07/01/2004    Years since quitting: 15.9  . Smokeless tobacco: Never Used  Vaping Use  . Vaping Use: Never used  Substance and Sexual Activity  . Alcohol use: Yes    Alcohol/week: 14.0 standard drinks    Types: 14 Glasses of wine per week  . Drug use: No  . Sexual activity: Yes    Partners: Female  Other Topics Concern  . Not on file  Social History  Narrative   Works as an Training and development officer (Conservation officer, [employer])   Married for 15 years.   No children   Chickens (egg laying)   Enjoys hiking, spending time with his wife, reading/games   Social Determinants of Health   Financial Resource Strain: Not on file  Food Insecurity: Not on file  Transportation Needs: Not on file  Physical Activity: Not on file  Stress: Not on file  Social Connections: Not on file  Intimate Partner Violence: Not on file     Physical Exam: BP (!) 142/80   Pulse 94   Ht 6' 1"  (1.854 m)   Wt 175 lb (79.4 kg)   SpO2 97%   BMI 23.09 kg/m  Constitutional: generally well-appearing Psychiatric:  alert and oriented x3 Abdomen: soft, nontender, nondistended, no obvious ascites, no peritoneal signs, normal bowel sounds No peripheral edema noted in lower extremities  Assessment and plan: 45 y.o. male with longstanding left-sided ulcerative colitis, diagnosed 2006  Doing overall generally quite well on Lialda 4 pills once daily.  He did require a recent steroid bolus and tapering regimen over about 3 or 4 weeks.  This is the first steroids he has needed in 2 or 3 years now.  He understands that he has had colitis for 16 years now and that probably increases his risk for colon cancer somewhat and so we will make sure that he is in our reminder system for a repeat colonoscopy 3 years from his last 1 which would be later this calendar year.  Return office visit with me in 12 months and sooner if needed.  Please see the "Patient Instructions" section for addition details about the plan.  Owens Loffler, MD [facility] 05/31/2020, 10:54 AM   Total time on date of encounter was 30 minutes (this included time spent preparing to see the patient reviewing records; obtaining and/or reviewing separately obtained history; performing a medically appropriate exam and/or evaluation; counseling and educating the patient and family if present; ordering medications, tests or  procedures if applicable; and documenting clinical information in the health record).

## 2020-06-05 ENCOUNTER — Encounter (INDEPENDENT_AMBULATORY_CARE_PROVIDER_SITE_OTHER): Payer: Self-pay | Admitting: Nurse Practitioner

## 2020-06-05 ENCOUNTER — Ambulatory Visit (INDEPENDENT_AMBULATORY_CARE_PROVIDER_SITE_OTHER): Payer: 59 | Admitting: Nurse Practitioner

## 2020-06-05 ENCOUNTER — Other Ambulatory Visit: Payer: Self-pay

## 2020-06-05 VITALS — BP 136/80 | HR 80 | Temp 97.5°F | Resp 18 | Ht 73.0 in | Wt 178.0 lb

## 2020-06-05 DIAGNOSIS — Z0001 Encounter for general adult medical examination with abnormal findings: Secondary | ICD-10-CM | POA: Diagnosis not present

## 2020-06-05 DIAGNOSIS — Z139 Encounter for screening, unspecified: Secondary | ICD-10-CM

## 2020-06-05 DIAGNOSIS — E785 Hyperlipidemia, unspecified: Secondary | ICD-10-CM

## 2020-06-05 DIAGNOSIS — E099 Drug or chemical induced diabetes mellitus without complications: Secondary | ICD-10-CM | POA: Diagnosis not present

## 2020-06-05 NOTE — Progress Notes (Signed)
Subjective:  Patient ID: Kenneth [name], male    DOB: 03-16-1975  Age: 45 y.o. MRN: [id number]  CC:  Chief Complaint  Patient presents with  . Annual Exam      HPI  This patient arrives today for the above.  Overall he is feeling well.  He does have a drug-induced diabetes and he is being followed by endocrinology.  He recently saw his endocrinologist approximately 3 months ago at which point he had blood work done including lipid panel, CBC, CMP, TSH, A1c in addition to others.  Lipid panel was mildly elevated with LDL of 101.  CBC was within normal limits.  CMP appeared to be within normal limits except for mildly elevated glucose.  TSH was within normal limits.  A1c was 7.2.  He is not interested in having pneumonia vaccine administered today.  He tells me he has had an eye exam within the last year.  He tells me he has his foot exam completed with his endocrinologist almost every visit and last visit as stated above was 3 months ago.  He has had hepatitis C screening.  He has not had HIV screening but would like to hold off on this unless [place] necessary.  He has not had COVID-19 vaccines administered.  He believes his last tetanus shot was administered within the last 5 to 8 years.  Past Medical History:  Diagnosis Date  . Colitis   . Crohn's disease ([place])   . Diabetes mellitus without complication ([place])   . Steroid-induced hyperglycemia       Family History  Problem Relation Age of Onset  . Heart disease Paternal Grandfather   . Heart attack Paternal Grandfather   . GER disease Father   . Hyperlipidemia Maternal Grandmother   . Stroke Maternal Grandmother   . Colon cancer Neg Hx   . Rectal cancer Neg Hx   . Stomach cancer Neg Hx   . Pancreatic cancer Neg Hx     Social History   Social History Narrative   Works as an Training and development officer (Conservation officer, [employer])   Married for 15 years.   No children   Chickens (egg laying)   Enjoys hiking, spending time with his wife,  reading/games   Social History   Tobacco Use  . Smoking status: Former Smoker    Types: Cigarettes    Quit date: 07/01/2004    Years since quitting: 15.9  . Smokeless tobacco: Never Used  Substance Use Topics  . Alcohol use: Yes    Alcohol/week: 14.0 standard drinks    Types: 14 Glasses of wine per week     Current Meds  Medication Sig  . Cholecalciferol 125 MCG (5000 UT) TABS Take 1 tablet by mouth daily.  . Continuous Blood Gluc Sensor (DEXCOM G6 SENSOR) MISC USE AS DIRECTED AND CHANGE SENSOR EVERY 10 DAYS  . Continuous Blood Gluc Transmit (DEXCOM G6 TRANSMITTER) MISC USE AS DIRECTED TO MONITOR BLOOD SUGARS  . glucose blood test strip USE TO CHECK BLOOD SUGAR 4 TIMES DAILY (Patient taking differently: USE TO CHECK BLOOD SUGAR 4 TIMES DAILY)  . insulin lispro (HUMALOG) 100 UNIT/ML KwikPen INJECT 4 OR 5 UNITS AT BREAKFAST, 7 OR 8 UNITS AT LUNCHES, 7 TO 9 UNITS AT EVENING MEAL 3 TIMES A DAY  . Insulin Pen Needle 32G X 4 MM MISC USE AS DIRECTED TO ADMINSTER INSULIN THREE TIMES A DAY  . Insulin Syringe-Needle U-100 30G X 5/16" 0.3 ML MISC USE 4 TIMES A  DAY WITH NOVOLOG AND LANTUS  . Isopropyl Alcohol (ALCOHOL WIPES) 70 % MISC See admin instructions.  . mesalamine (LIALDA) 1.2 g EC tablet TAKE 4 TABLETS BY MOUTH DAILY  . OVER THE COUNTER MEDICATION CBD Oil.  As needed daily.  . TURMERIC PO Take 3 g by mouth daily.    ROS:  Review of Systems  Respiratory: Negative for shortness of breath.   Cardiovascular: Negative for chest pain and claudication.  Neurological: Negative for dizziness and headaches.     Objective:   Today's Vitals: BP 136/80 (BP Location: Left Arm, Patient Position: Sitting, Cuff Size: Normal)   Pulse 80   Temp (!) 97.5 F (36.4 C) (Temporal)   Resp 18   Ht 6' 1"  (1.854 m)   Wt 178 lb (80.7 kg)   SpO2 96%   BMI 23.48 kg/m  Vitals with BMI 06/05/2020 05/31/2020 03/09/2020  Height 6' 1"  6' 1"  6' 1"   Weight 178 lbs 175 lbs 178 lbs 8 oz  BMI 23.49 76.16  07.37  Systolic 106 269 485  Diastolic 80 80 90  Pulse 80 94 77     Physical Exam Vitals reviewed.  Constitutional:      General: He is not in acute distress.    Appearance: Normal appearance. He is not ill-appearing.  HENT:     Head: Normocephalic and atraumatic.     Right Ear: Tympanic membrane, ear canal and external ear normal.     Left Ear: Tympanic membrane, ear canal and external ear normal.  Eyes:     General: No scleral icterus.    Extraocular Movements: Extraocular movements intact.     Conjunctiva/sclera: Conjunctivae normal.     Pupils: Pupils are equal, round, and reactive to light.  Neck:     Vascular: No carotid bruit.  Cardiovascular:     Rate and Rhythm: Normal rate and regular rhythm.     Pulses: Normal pulses.     Heart sounds: Normal heart sounds.     Comments: Hair loss noted to bilateral lower extremities Pulmonary:     Effort: Pulmonary effort is normal.     Breath sounds: Normal breath sounds.  Abdominal:     General: Bowel sounds are normal. There is no distension.     Palpations: There is no mass.     Tenderness: There is no abdominal tenderness.     Hernia: No hernia is present.  Musculoskeletal:        General: No swelling or tenderness.     Cervical back: Normal range of motion and neck supple. No rigidity.  Lymphadenopathy:     Cervical: No cervical adenopathy.  Skin:    General: Skin is warm and dry.  Neurological:     General: No focal deficit present.     Mental Status: He is alert and oriented to person, place, and time.     Cranial Nerves: No cranial nerve deficit.     Sensory: No sensory deficit.     Motor: No weakness.     Gait: Gait normal.  Psychiatric:        Mood and Affect: Mood normal.        Behavior: Behavior normal.        Judgment: Judgment normal.          Assessment and Plan   1. Encounter for general adult medical examination with abnormal findings   2. 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  Screening for condition   4. Hyperlipidemia, unspecified hyperlipidemia  type      Plan: 1.-4.  Overall annual exam is grossly normal.  Blood work recently completed by his endocrinologist, will not repeat today. We did discuss recommendations that he get the PPSV23 vaccine and covid19 vaccines. For now he has declined to have these administered. He was encouraged to let us know if he experiences a cut in the future so we can administer a tetanus booster in the event that his previous tetanus shot has expired as he is not certain of last date. He has declined HIV testing for now. No other concerns voiced today. Patient recommended to continue following up with his endocrinologist and to follow-up with [place] in 6 months or sooner.  He voices his understanding.     Tests ordered No orders of the defined types were placed in this encounter.     No orders of the defined types were placed in this encounter.   Patient to follow-up in 6 months or sooner as needed  Ailene Ards, NP

## 2020-06-07 ENCOUNTER — Other Ambulatory Visit (HOSPITAL_BASED_OUTPATIENT_CLINIC_OR_DEPARTMENT_OTHER): Payer: Self-pay

## 2020-06-07 ENCOUNTER — Encounter (INDEPENDENT_AMBULATORY_CARE_PROVIDER_SITE_OTHER): Payer: 59 | Admitting: Nurse Practitioner

## 2020-06-08 ENCOUNTER — Encounter (INDEPENDENT_AMBULATORY_CARE_PROVIDER_SITE_OTHER): Payer: 59 | Admitting: Nurse Practitioner

## 2020-06-28 ENCOUNTER — Other Ambulatory Visit (HOSPITAL_BASED_OUTPATIENT_CLINIC_OR_DEPARTMENT_OTHER): Payer: Self-pay

## 2020-06-28 MED FILL — Continuous Glucose System Sensor: 30 days supply | Qty: 3 | Fill #1 | Status: AC

## 2020-07-07 ENCOUNTER — Other Ambulatory Visit (HOSPITAL_BASED_OUTPATIENT_CLINIC_OR_DEPARTMENT_OTHER): Payer: Self-pay

## 2020-07-07 ENCOUNTER — Other Ambulatory Visit: Payer: Self-pay | Admitting: Gastroenterology

## 2020-07-07 MED ORDER — MESALAMINE 1.2 G PO TBEC
DELAYED_RELEASE_TABLET | Freq: Every day | ORAL | 1 refills | Status: DC
  Filled 2020-07-07: qty 120, 30d supply, fill #0
  Filled 2020-08-11: qty 120, 30d supply, fill #1

## 2020-07-24 ENCOUNTER — Other Ambulatory Visit (HOSPITAL_COMMUNITY): Payer: Self-pay

## 2020-08-03 ENCOUNTER — Other Ambulatory Visit (HOSPITAL_BASED_OUTPATIENT_CLINIC_OR_DEPARTMENT_OTHER): Payer: Self-pay

## 2020-08-03 MED FILL — Continuous Glucose System Sensor: 30 days supply | Qty: 3 | Fill #2 | Status: AC

## 2020-08-03 MED FILL — Continuous Glucose System Transmitter: 90 days supply | Qty: 1 | Fill #1 | Status: AC

## 2020-08-11 ENCOUNTER — Other Ambulatory Visit (HOSPITAL_BASED_OUTPATIENT_CLINIC_OR_DEPARTMENT_OTHER): Payer: Self-pay

## 2020-08-21 ENCOUNTER — Other Ambulatory Visit (HOSPITAL_BASED_OUTPATIENT_CLINIC_OR_DEPARTMENT_OTHER): Payer: Self-pay

## 2020-08-21 MED FILL — Insulin Pen Needle 32 G X 4 MM (1/6" or 5/32"): 30 days supply | Qty: 100 | Fill #1 | Status: AC

## 2020-09-12 ENCOUNTER — Other Ambulatory Visit: Payer: Self-pay | Admitting: Gastroenterology

## 2020-09-12 ENCOUNTER — Other Ambulatory Visit (HOSPITAL_BASED_OUTPATIENT_CLINIC_OR_DEPARTMENT_OTHER): Payer: Self-pay

## 2020-09-12 ENCOUNTER — Other Ambulatory Visit: Payer: Self-pay

## 2020-09-12 MED ORDER — MESALAMINE 1.2 G PO TBEC
4.8000 g | DELAYED_RELEASE_TABLET | Freq: Every day | ORAL | 9 refills | Status: DC
  Filled 2020-09-12: qty 120, 30d supply, fill #0
  Filled 2020-10-12: qty 120, 30d supply, fill #1
  Filled 2020-11-15: qty 120, 30d supply, fill #2
  Filled 2020-12-18: qty 120, 30d supply, fill #3
  Filled 2021-01-23: qty 120, 30d supply, fill #4
  Filled 2021-02-27: qty 120, 30d supply, fill #5
  Filled 2021-03-27: qty 120, 30d supply, fill #6
  Filled 2021-04-25: qty 120, 30d supply, fill #7
  Filled 2021-05-31: qty 120, 30d supply, fill #8
  Filled 2021-07-02: qty 120, 30d supply, fill #9

## 2020-09-12 MED ORDER — MESALAMINE 1.2 G PO TBEC
1.2000 g | DELAYED_RELEASE_TABLET | Freq: Every day | ORAL | 9 refills | Status: DC
  Filled 2020-09-12: qty 120, 120d supply, fill #0

## 2020-09-19 ENCOUNTER — Other Ambulatory Visit (HOSPITAL_BASED_OUTPATIENT_CLINIC_OR_DEPARTMENT_OTHER): Payer: Self-pay

## 2020-09-19 MED ORDER — DEXCOM G6 SENSOR MISC
11 refills | Status: DC
  Filled 2020-09-19: qty 3, 30d supply, fill #0
  Filled 2020-11-09: qty 3, 30d supply, fill #1
  Filled 2020-12-18: qty 3, 30d supply, fill #2
  Filled 2021-01-29: qty 3, 30d supply, fill #3
  Filled 2021-03-19: qty 3, 30d supply, fill #4
  Filled 2021-05-01: qty 3, 30d supply, fill #5
  Filled 2021-06-05: qty 3, 30d supply, fill #6
  Filled 2021-07-09: qty 3, 30d supply, fill #7
  Filled 2021-08-10: qty 3, 30d supply, fill #8
  Filled 2021-09-14: qty 3, 30d supply, fill #9

## 2020-09-20 ENCOUNTER — Other Ambulatory Visit (HOSPITAL_BASED_OUTPATIENT_CLINIC_OR_DEPARTMENT_OTHER): Payer: Self-pay

## 2020-09-21 ENCOUNTER — Other Ambulatory Visit (HOSPITAL_BASED_OUTPATIENT_CLINIC_OR_DEPARTMENT_OTHER): Payer: Self-pay

## 2020-10-12 ENCOUNTER — Other Ambulatory Visit (HOSPITAL_BASED_OUTPATIENT_CLINIC_OR_DEPARTMENT_OTHER): Payer: Self-pay

## 2020-10-16 DIAGNOSIS — E099 Drug or chemical induced diabetes mellitus without complications: Secondary | ICD-10-CM | POA: Diagnosis not present

## 2020-10-16 DIAGNOSIS — Z794 Long term (current) use of insulin: Secondary | ICD-10-CM | POA: Diagnosis not present

## 2020-10-16 DIAGNOSIS — E1165 Type 2 diabetes mellitus with hyperglycemia: Secondary | ICD-10-CM | POA: Diagnosis not present

## 2020-10-18 ENCOUNTER — Encounter: Payer: Self-pay | Admitting: Gastroenterology

## 2020-11-09 ENCOUNTER — Other Ambulatory Visit (HOSPITAL_BASED_OUTPATIENT_CLINIC_OR_DEPARTMENT_OTHER): Payer: Self-pay

## 2020-11-09 ENCOUNTER — Other Ambulatory Visit (INDEPENDENT_AMBULATORY_CARE_PROVIDER_SITE_OTHER): Payer: Self-pay

## 2020-11-09 MED FILL — Glucose Blood Test Strip: 90 days supply | Qty: 400 | Fill #0 | Status: AC

## 2020-11-15 ENCOUNTER — Other Ambulatory Visit (HOSPITAL_BASED_OUTPATIENT_CLINIC_OR_DEPARTMENT_OTHER): Payer: Self-pay

## 2020-11-15 MED ORDER — DEXCOM G6 TRANSMITTER MISC
3 refills | Status: DC
  Filled 2020-11-15: qty 1, 90d supply, fill #0
  Filled 2021-03-14: qty 1, 90d supply, fill #1
  Filled 2021-07-09: qty 1, 90d supply, fill #2
  Filled 2021-10-26 – 2021-11-07 (×2): qty 1, 90d supply, fill #3

## 2020-11-16 ENCOUNTER — Telehealth: Payer: 59 | Admitting: Physician Assistant

## 2020-11-16 DIAGNOSIS — J019 Acute sinusitis, unspecified: Secondary | ICD-10-CM

## 2020-11-16 DIAGNOSIS — B9789 Other viral agents as the cause of diseases classified elsewhere: Secondary | ICD-10-CM | POA: Diagnosis not present

## 2020-11-16 MED ORDER — BENZONATATE 100 MG PO CAPS: 100.0000 mg | ORAL_CAPSULE | Freq: Three times a day (TID) | ORAL | 0 refills | Status: DC | PRN

## 2020-11-16 MED ORDER — IPRATROPIUM BROMIDE 0.03 % NA SOLN: 2.0000 | Freq: Two times a day (BID) | NASAL | 0 refills | Status: DC

## 2020-11-16 NOTE — Addendum Note (Signed)
Addended by: Mar Daring on: 11/16/2020 12:40 PM   Modules accepted: Orders

## 2020-11-16 NOTE — Progress Notes (Signed)
E-Visit for Sinus Problems  We are sorry that you are not feeling well.  Here is how we plan to help!  Based on what you have shared with me it looks like you have sinusitis.  Sinusitis is inflammation and infection in the sinus cavities of the head.  Based on your presentation I believe you most likely have Acute Viral Sinusitis.This is an infection most likely caused by a virus. There is not specific treatment for viral sinusitis other than to help you with the symptoms until the infection runs its course.  You may use an oral decongestant such as Mucinex D or if you have glaucoma or high blood pressure use plain Mucinex. Saline nasal spray help and can safely be used as often as needed for congestion, I have prescribed: Ipratropium Bromide nasal spray 0.03% 2 sprays in eah nostril 2-3 times a day and tessalon perles for cough.  Some authorities believe that zinc sprays or the use of Echinacea may shorten the course of your symptoms.  Sinus infections are not as easily transmitted as other respiratory infection, however we still recommend that you avoid close contact with loved ones, especially the very young and elderly.  Remember to wash your hands thoroughly throughout the day as this is the number one way to prevent the spread of infection!  Home Care: Only take medications as instructed by your medical team. Do not take these medications with alcohol. A steam or ultrasonic humidifier can help congestion.  You can place a towel over your head and breathe in the steam from hot water coming from a faucet. Avoid close contacts especially the very young and the elderly. Cover your mouth when you cough or sneeze. Always remember to wash your hands.  Get Help Right Away If: You develop worsening fever or sinus pain. You develop a severe head ache or visual changes. Your symptoms persist after you have completed your treatment plan.  Make sure you Understand these instructions. Will watch  your condition. Will get help right away if you are not doing well or get worse.   Thank you for choosing an e-visit.  Your e-visit answers were reviewed by a board certified advanced clinical practitioner to complete your personal care plan. Depending upon the condition, your plan could have included both over the counter or prescription medications.  Please review your pharmacy choice. Make sure the pharmacy is open so you can pick up prescription now. If there is a problem, you may contact your provider through [employer] and have the prescription routed to another pharmacy.  Your safety is important to [place]. If you have drug allergies check your prescription carefully.   For the next 24 hours you can use MyChart to ask questions about today's visit, request a non-urgent call back, or ask for a work or school excuse. You will get an email in the next two days asking about your experience. I hope that your e-visit has been valuable and will speed your recovery.  I provided 5 minutes of non face-to-face time during this encounter for chart review and documentation.

## 2020-11-22 ENCOUNTER — Other Ambulatory Visit (HOSPITAL_BASED_OUTPATIENT_CLINIC_OR_DEPARTMENT_OTHER): Payer: Self-pay

## 2020-11-22 MED ORDER — ULTICARE MICRO PEN NEEDLES 32G X 4 MM MISC
11 refills | Status: DC
  Filled 2020-11-22: qty 100, 30d supply, fill #0
  Filled 2021-03-14: qty 100, 30d supply, fill #1
  Filled 2021-07-25: qty 100, 30d supply, fill #2

## 2020-11-22 MED ORDER — BASAGLAR KWIKPEN 100 UNIT/ML ~~LOC~~ SOPN
PEN_INJECTOR | SUBCUTANEOUS | 3 refills | Status: DC
  Filled 2020-11-22 (×3): qty 45, 90d supply, fill #0

## 2020-11-23 ENCOUNTER — Other Ambulatory Visit (HOSPITAL_BASED_OUTPATIENT_CLINIC_OR_DEPARTMENT_OTHER): Payer: Self-pay

## 2020-11-23 MED ORDER — TRESIBA FLEXTOUCH 100 UNIT/ML ~~LOC~~ SOPN
PEN_INJECTOR | SUBCUTANEOUS | 3 refills | Status: DC
  Filled 2020-11-23: qty 45, 90d supply, fill #0
  Filled 2021-08-15: qty 45, 90d supply, fill #1
  Filled 2021-11-23: qty 45, 90d supply, fill #2

## 2020-11-24 ENCOUNTER — Other Ambulatory Visit (HOSPITAL_BASED_OUTPATIENT_CLINIC_OR_DEPARTMENT_OTHER): Payer: Self-pay

## 2020-11-27 ENCOUNTER — Ambulatory Visit (INDEPENDENT_AMBULATORY_CARE_PROVIDER_SITE_OTHER): Payer: 59 | Admitting: Internal Medicine

## 2020-12-07 ENCOUNTER — Telehealth: Payer: 59 | Admitting: Physician Assistant

## 2020-12-07 DIAGNOSIS — J019 Acute sinusitis, unspecified: Secondary | ICD-10-CM | POA: Diagnosis not present

## 2020-12-07 MED ORDER — AMOXICILLIN-POT CLAVULANATE 875-125 MG PO TABS: 1.0000 | ORAL_TABLET | Freq: Two times a day (BID) | ORAL | 0 refills | Status: AC

## 2020-12-07 NOTE — Progress Notes (Signed)

## 2020-12-12 DIAGNOSIS — E1165 Type 2 diabetes mellitus with hyperglycemia: Secondary | ICD-10-CM | POA: Diagnosis not present

## 2020-12-12 DIAGNOSIS — H52223 Regular astigmatism, bilateral: Secondary | ICD-10-CM | POA: Diagnosis not present

## 2020-12-12 DIAGNOSIS — H5213 Myopia, bilateral: Secondary | ICD-10-CM | POA: Diagnosis not present

## 2020-12-15 ENCOUNTER — Other Ambulatory Visit (HOSPITAL_BASED_OUTPATIENT_CLINIC_OR_DEPARTMENT_OTHER): Payer: Self-pay

## 2020-12-15 ENCOUNTER — Ambulatory Visit (AMBULATORY_SURGERY_CENTER): Payer: 59 | Admitting: *Deleted

## 2020-12-15 ENCOUNTER — Encounter: Payer: Self-pay | Admitting: Gastroenterology

## 2020-12-15 ENCOUNTER — Other Ambulatory Visit: Payer: Self-pay

## 2020-12-15 VITALS — Ht 73.0 in | Wt 178.0 lb

## 2020-12-15 DIAGNOSIS — K51919 Ulcerative colitis, unspecified with unspecified complications: Secondary | ICD-10-CM

## 2020-12-15 MED ORDER — NA SULFATE-K SULFATE-MG SULF 17.5-3.13-1.6 GM/177ML PO SOLN
1.0000 | ORAL | 0 refills | Status: DC
  Filled 2020-12-15: qty 354, 1d supply, fill #0

## 2020-12-15 NOTE — Progress Notes (Signed)
Patient's pre-visit was done today over the phone with the patient. Name,DOB and address verified. Patient denies any allergies to Eggs and Soy. Patient denies any problems with anesthesia/sedation. Patient is not taking any diet pills or blood thinners. No home Oxygen. Packet of Prep instructions mailed to patient including a copy of a consent form-pt is aware. Patient understands to call us back with any questions or concerns. Patient is aware of our care-partner policy and [id number] safety protocol.   EMMI education assigned to the patient for the procedure, sent to [facility].

## 2020-12-18 ENCOUNTER — Other Ambulatory Visit (HOSPITAL_BASED_OUTPATIENT_CLINIC_OR_DEPARTMENT_OTHER): Payer: Self-pay

## 2020-12-25 ENCOUNTER — Encounter: Payer: Self-pay | Admitting: Gastroenterology

## 2020-12-25 NOTE — Telephone Encounter (Signed)
Spoke with the patient and explained that the Golytely prep is a split dose prep also and you still get up 6 hours prior to colonoscopy yo drink the 2nd half. Patient will stay with the suprep and get up at 2 am to drink the 2nd dose of suprep.

## 2020-12-28 ENCOUNTER — Encounter: Payer: Self-pay | Admitting: Gastroenterology

## 2020-12-28 NOTE — Telephone Encounter (Signed)
Dr Ardis Hughs please see message from pt.  He felt bad yesterday with temp of 100.1.  No fever this morning and feels fine.  COVID test negative.  Can he still proceed with procedure tomorrow?

## 2020-12-29 ENCOUNTER — Ambulatory Visit (AMBULATORY_SURGERY_CENTER): Payer: 59 | Admitting: Gastroenterology

## 2020-12-29 ENCOUNTER — Other Ambulatory Visit: Payer: Self-pay

## 2020-12-29 ENCOUNTER — Encounter: Payer: Self-pay | Admitting: Gastroenterology

## 2020-12-29 VITALS — BP 108/75 | HR 96 | Temp 98.1°F | Resp 15 | Ht 73.0 in | Wt 178.0 lb

## 2020-12-29 DIAGNOSIS — K51919 Ulcerative colitis, unspecified with unspecified complications: Secondary | ICD-10-CM

## 2020-12-29 DIAGNOSIS — K529 Noninfective gastroenteritis and colitis, unspecified: Secondary | ICD-10-CM | POA: Diagnosis not present

## 2020-12-29 MED ORDER — SODIUM CHLORIDE 0.9 % IV SOLN: 500.0000 mL | Freq: Once | INTRAVENOUS | Status: DC

## 2020-12-29 NOTE — Patient Instructions (Addendum)
Read all of the handouts given to you by your recover room nurse.  Please, keep your office visit.  YOU HAD AN ENDOSCOPIC PROCEDURE TODAY AT [facility]:   Refer to the procedure report that was given to you for any specific questions about what was found during the examination.  If the procedure report does not answer your questions, please call your gastroenterologist to clarify.  If you requested that your care partner not be given the details of your procedure findings, then the procedure report has been included in a sealed envelope for you to review at your convenience later.  YOU SHOULD EXPECT: Some feelings of bloating in the abdomen. Passage of more gas than usual.  Walking can help get rid of the air that was put into your GI tract during the procedure and reduce the bloating. If you had a lower endoscopy (such as a colonoscopy or flexible sigmoidoscopy) you may notice spotting of blood in your stool or on the toilet paper. If you underwent a bowel prep for your procedure, you may not have a normal bowel movement for a few days.  Please Note:  You might notice some irritation and congestion in your nose or some drainage.  This is from the oxygen used during your procedure.  There is no need for concern and it should clear up in a day or so.  SYMPTOMS TO REPORT IMMEDIATELY:  Following lower endoscopy (colonoscopy or flexible sigmoidoscopy):  Excessive amounts of blood in the stool  Significant tenderness or worsening of abdominal pains  Swelling of the abdomen that is new, acute  Fever of 100F or higher   For urgent or emergent issues, a gastroenterologist can be reached at any hour by calling [phone]. Do not use MyChart messaging for urgent concerns.    DIET:  We do recommend a small meal at first, but then you may proceed to your regular diet.  Drink plenty of fluids but you should avoid alcoholic beverages for 24 hours.  ACTIVITY:  You should plan to take  it easy for the rest of today and you should NOT DRIVE or use heavy machinery until tomorrow (because of the sedation medicines used during the test).    FOLLOW UP: Our staff will call the number listed on your records 48-72 hours following your procedure to check on you and address any questions or concerns that you may have regarding the information given to you following your procedure. If we do not reach you, we will leave a message.  We will attempt to reach you two times.  During this call, we will ask if you have developed any symptoms of COVID 19. If you develop any symptoms (ie: fever, flu-like symptoms, shortness of breath, cough etc.) before then, please call [phone].  If you test positive for Covid 19 in the 2 weeks post procedure, please call and report this information to [place].    If any biopsies were taken you will be contacted by phone or by letter within the next 1-3 weeks.  Please call us at [phone] if you have not heard about the biopsies in 3 weeks.    SIGNATURES/CONFIDENTIALITY: You and/or your care partner have signed paperwork which will be entered into your electronic medical record.  These signatures attest to the fact that that the information above on your After Visit Summary has been reviewed and is understood.  Full responsibility of the confidentiality of this discharge information lies with you and/or your  care-partner.

## 2020-12-29 NOTE — Progress Notes (Signed)
Pt's states no medical or surgical changes since previsit or office visit.   VS taken by CW 

## 2020-12-29 NOTE — Progress Notes (Signed)
Called to room to assist during endoscopic procedure.  Patient ID and intended procedure confirmed with present staff. Received instructions for my participation in the procedure from the performing physician.  

## 2020-12-29 NOTE — Progress Notes (Signed)
Sedate, gd SR, tolerated procedure well, VSS, report to RN 

## 2020-12-29 NOTE — Progress Notes (Signed)
Review of pertinent gastrointestinal problems:   1. Extensive UC: Colonoscopy Dr. Penelope [name] 2006 up to 30cm. Mesalamine products did not seem to help very well. Significant flare summer 2009 steroids started. Had extreme hyperglycemia on steroids requiring insulin. Repeat colonoscopy December, 2009 showed inflammation moderate To 70 cm. He was started on sulfasalazine. Had drug rash, likely pneumonitis, LFT abnormalities ( transaminases up to almost 1000, bili up to 6). Holding his medicines resulted in complete resolution of abnormal labs checked serially. TPMT testing January, 2010 showed normal enzyme activity. February, 2010 tapered off all medicines and feels very well. May, 2010: flaring of symptoms for the past month, He went to see a [place] herbalist, did not start the oral mesalamine that I recommended, restarted prednisone. September, 2010 self-medicating with prednisone however tapering off. January, 2011, : doing well off traditional IBD medicines, taking Chinese Herb. 05/2011 severe flare, (abd pain, weight loss, bloody diarrhea Hb 6s), required admission, iv steroids, blood transfusion. October 2014: flare of symptoms (had stopped mesalamine 4 months prior); 01/2013 flare improved with prednisone and restarting lialda. Flared 06/2014, prednisone 23m BID. OV 10/2016 doing well on full dose lialda.  Colonoscopy November 2019 for chronic colitis showed normal terminal ileum, colonic mucosa was abnormal throughout.  To have mild active inflammation in the rectosigmoid.  And burned-out inflammation throughout remaining segments.  Multiple pseudopolyps throughout.  Colon was biopsied extensively.  Biopsies showed no sign of dysplasia and no active or chronic inflammation.  I recommended recall at 3 years 2. swollen, somewhat thrombosed external hemorrhoid July 2013  HPI: This is a man with longstanding, extensive UC   ROS: complete GI ROS as described in HPI, all other review negative.  Constitutional:   No unintentional weight loss   Past Medical History:  Diagnosis Date   Blood transfusion without reported diagnosis    Colitis    Crohn's disease ([facility]    Diabetes mellitus without complication ([facility]    Steroid-induced hyperglycemia     Past Surgical History:  Procedure Laterality Date   COLONOSCOPY  2019   [facility]  HERNIA REPAIR  2010   left   left thumb surgery  2000   WISDOM TOOTH EXTRACTION  11/2010    Current Outpatient Medications  Medication Sig Dispense Refill   Cholecalciferol 125 MCG (5000 UT) TABS Take 1 tablet by mouth daily.     Continuous Blood Gluc Sensor (DEXCOM G6 SENSOR) MISC Use as directed and change sensor every 10 days 3 each 11   Continuous Blood Gluc Transmit (DEXCOM G6 TRANSMITTER) MISC Use as directed. 1 each 3   glucose blood test strip USE TO CHECK BLOOD SUGAR 4 TIMES DAILY (Patient taking differently: USE TO CHECK BLOOD SUGAR 4 TIMES DAILY) 400 strip 2   insulin degludec (TRESIBA FLEXTOUCH) 100 UNIT/ML FlexTouch Pen Inject 23 units (titrate as directed, max daily dose of 50 units) under the skin once a day 45 mL 3   insulin lispro (HUMALOG) 100 UNIT/ML KwikPen INJECT 4 OR 5 UNITS AT BREAKFAST, 7 OR 8 UNITS AT LUNCHES, 7 TO 9 UNITS AT EVENING MEAL 3 TIMES A DAY 30 mL 3   Insulin Pen Needle (ULTICARE MICRO PEN NEEDLES) 32G X 4 MM MISC Use as directed to administer insulin 3 times a day 100 each 11   Insulin Syringe-Needle U-100 30G X 5/16" 0.3 ML MISC USE 4 TIMES A DAY WITH NOVOLOG AND LANTUS     Isopropyl Alcohol (ALCOHOL WIPES) 70 % MISC See admin instructions.  mesalamine (LIALDA) 1.2 g EC tablet Take 4 tablets (4.8 g total) by mouth daily. 120 tablet 9   OVER THE COUNTER MEDICATION CBD Oil.  As needed daily.     TURMERIC PO Take 3 g by mouth daily.     Current Facility-Administered Medications  Medication Dose Route Frequency Provider Last Rate Last Admin   0.9 %  sodium chloride infusion  500 mL Intravenous Once Kenneth Banister, MD         Allergies as of 12/29/2020 - Review Complete 12/29/2020  Allergen Reaction Noted   Clomiphene  09/30/2019   Moxifloxacin Other (See Comments)    Sulfonamide derivatives Other (See Comments)     Family History  Problem Relation Age of Onset   GER disease Father    Hyperlipidemia Maternal Grandmother    Stroke Maternal Grandmother    Heart disease Paternal Grandfather    Heart attack Paternal Grandfather    Colon cancer Neg Hx    Rectal cancer Neg Hx    Stomach cancer Neg Hx    Pancreatic cancer Neg Hx    Esophageal cancer Neg Hx     Social History   Socioeconomic History   Marital status: Married    Spouse name: Not on file   Number of children: 0   Years of education: Not on file   Highest education level: Not on file  Occupational History   Occupation: artist  Tobacco Use   Smoking status: Former    Types: Cigarettes    Quit date: 07/01/2004    Years since quitting: 16.5   Smokeless tobacco: Never  Vaping Use   Vaping Use: Never used  Substance and Sexual Activity   Alcohol use: Yes    Alcohol/week: 14.0 standard drinks    Types: 14 Glasses of wine per week   Drug use: No   Sexual activity: Yes    Partners: Female  Other Topics Concern   Not on file  Social History Narrative   Works as an Training and development officer (Conservation officer, [employer])   Married for 15 years.   No children   Chickens (egg laying)   Enjoys hiking, spending time with his wife, reading/games   Social Determinants of Health   Financial Resource Strain: Not on file  Food Insecurity: Not on file  Transportation Needs: Not on file  Physical Activity: Not on file  Stress: Not on file  Social Connections: Not on file  Intimate Partner Violence: Not on file     Physical Exam: BP 125/84    Pulse (!) 105    Temp 98.1 F (36.7 C)    Ht 6' 1"  (1.854 m)    Wt 178 lb (80.7 kg)    SpO2 100%    BMI 23.48 kg/m  Constitutional: generally well-appearing Psychiatric: alert and oriented x3 Lungs: CTA  bilaterally Heart: no MCR  Assessment and plan: 45 y.o. male with longstanding, extensive UC  Colonoscoy today  Care is appropriate for the ambulatory setting.  Kenneth Loffler, MD [facility] 12/29/2020, 7:35 AM

## 2020-12-29 NOTE — Op Note (Signed)
[facility] Patient Name: Kenneth [name] Procedure Date: 12/29/2020 7:22 AM MRN: [id number] Endoscopist: Milus Banister , MD Age: 45 Referring MD:  Date of Birth: 1975/11/11 Gender: Male Account #: [id number] Procedure:                Colonoscopy Indications:              Longstanding, extensive UC. Last colonoscopy 3                            years ago. Clinically doing well on lialda 4 pills                            daily Medicines:                Monitored Anesthesia Care Procedure:                Pre-Anesthesia Assessment:                           - Prior to the procedure, a History and Physical                            was performed, and patient medications and                            allergies were reviewed. The patient's tolerance of                            previous anesthesia was also reviewed. The risks                            and benefits of the procedure and the sedation                            options and risks were discussed with the patient.                            All questions were answered, and informed consent                            was obtained. Prior Anticoagulants: The patient has                            taken no previous anticoagulant or antiplatelet                            agents. ASA Grade Assessment: II - A patient with                            mild systemic disease. After reviewing the risks                            and benefits, the patient was deemed in  satisfactory condition to undergo the procedure.                           After obtaining informed consent, the colonoscope                            was passed under direct vision. Throughout the                            procedure, the patient's blood pressure, pulse, and                            oxygen saturations were monitored continuously. The                            CF [id number] #[id number] was introduced through the anus                             and advanced to the the terminal ileum. The                            colonoscopy was performed without difficulty. The                            patient tolerated the procedure well. The quality                            of the bowel preparation was good. The terminal                            ileum, ileocecal valve, appendiceal orifice, and                            rectum were photographed. Scope In: 7:59:50 AM Scope Out: 8:17:09 AM Scope Withdrawal Time: 0 hours 14 minutes 42 seconds  Total Procedure Duration: 0 hours 17 minutes 19 seconds  Findings:                 The neo-terminal ileum appeared normal.                           The mucosa throughout the colon was abnormal. There                            was mild inflammation and psuedopolyps in all                            segments and much worse in segment described below.                            The right colon was extensively sampled (jar 1).                            The left colon was extensively sampled (jar 2).  There was a 20cm segement of circumferentially                            severe colitis around the splenic flexure. The                            distal edge of this segment was at 50cm. This was                            extensively biopsied (jar 3).                           The exam was otherwise without abnormality on                            direct and retroflexion views. Complications:            No immediate complications. Estimated blood loss:                            None. Estimated Blood Loss:     Estimated blood loss: none. Impression:               - The examined portion of the ileum was normal.                           - Poorly controlled, extensive UC. Extensively                            biopsied. Recommendation:           - Patient has a contact number available for                            emergencies. The signs and symptoms of  potential                            delayed complications were discussed with the                            patient. Return to normal activities tomorrow.                            Written discharge instructions were provided to the                            patient.                           - Resume previous diet.                           - Continue present medications.                           - Await pathology results.                           -  OV, first available with Dr. Ardis Hughs to discuss                            biologic options. Milus Banister, MD 12/29/2020 8:28:03 AM This report has been signed electronically.

## 2021-01-02 ENCOUNTER — Telehealth: Payer: Self-pay

## 2021-01-02 NOTE — Telephone Encounter (Signed)
°  Follow up Call-  Call back number 12/29/2020  Post procedure Call Back phone  # [phone]  Permission to leave phone message Yes  Some recent data might be hidden     Patient questions:  Do you have a fever, pain , or abdominal swelling? No. Pain Score  0 *  Have you tolerated food without any problems? Yes.    Have you been able to return to your normal activities? Yes.    Do you have any questions about your discharge instructions: Diet   No. Medications  No. Follow up visit  No.  Do you have questions or concerns about your Care? No.  Actions: * If pain score is 4 or above: No action needed, pain <4.  Have you developed a fever since your procedure? no  2.   Have you had an respiratory symptoms (SOB or cough) since your procedure? no  3.   Have you tested positive for COVID 19 since your procedure no  4.   Have you had any family members/close contacts diagnosed with the COVID 19 since your procedure?  no   If yes to any of these questions please route to Joylene John, RN and Joella Prince, RN

## 2021-01-02 NOTE — Telephone Encounter (Signed)
Left message on follow up call. 

## 2021-01-04 ENCOUNTER — Other Ambulatory Visit (HOSPITAL_BASED_OUTPATIENT_CLINIC_OR_DEPARTMENT_OTHER): Payer: Self-pay

## 2021-01-04 ENCOUNTER — Other Ambulatory Visit (INDEPENDENT_AMBULATORY_CARE_PROVIDER_SITE_OTHER): Payer: Self-pay

## 2021-01-05 ENCOUNTER — Other Ambulatory Visit: Payer: Self-pay

## 2021-01-05 ENCOUNTER — Telehealth: Payer: Self-pay | Admitting: Gastroenterology

## 2021-01-05 ENCOUNTER — Other Ambulatory Visit (INDEPENDENT_AMBULATORY_CARE_PROVIDER_SITE_OTHER): Payer: Self-pay

## 2021-01-05 ENCOUNTER — Other Ambulatory Visit (HOSPITAL_BASED_OUTPATIENT_CLINIC_OR_DEPARTMENT_OTHER): Payer: Self-pay

## 2021-01-05 DIAGNOSIS — K51919 Ulcerative colitis, unspecified with unspecified complications: Secondary | ICD-10-CM

## 2021-01-05 MED ORDER — INSULIN LISPRO (1 UNIT DIAL) 100 UNIT/ML (KWIKPEN)
PEN_INJECTOR | SUBCUTANEOUS | 11 refills | Status: AC
Start: 2021-01-05 — End: ?
  Filled 2021-01-05: qty 15, 90d supply, fill #0
  Filled 2021-03-08: qty 15, 90d supply, fill #1
  Filled 2021-03-09: qty 15, 75d supply, fill #1
  Filled 2021-05-16: qty 15, 75d supply, fill #2

## 2021-01-05 NOTE — Telephone Encounter (Signed)
Inbound call from patient stating he was returning doctor's call but do not see any notes in chart.

## 2021-01-05 NOTE — Telephone Encounter (Signed)
Pt called in and stated that he received a call from Dr. Ardis Hughs regarding biopsies, and was returning his call. Please advise.

## 2021-01-09 ENCOUNTER — Other Ambulatory Visit (HOSPITAL_BASED_OUTPATIENT_CLINIC_OR_DEPARTMENT_OTHER): Payer: Self-pay

## 2021-01-23 ENCOUNTER — Other Ambulatory Visit (HOSPITAL_BASED_OUTPATIENT_CLINIC_OR_DEPARTMENT_OTHER): Payer: Self-pay

## 2021-01-24 ENCOUNTER — Other Ambulatory Visit (HOSPITAL_BASED_OUTPATIENT_CLINIC_OR_DEPARTMENT_OTHER): Payer: Self-pay

## 2021-01-29 ENCOUNTER — Other Ambulatory Visit (HOSPITAL_BASED_OUTPATIENT_CLINIC_OR_DEPARTMENT_OTHER): Payer: Self-pay

## 2021-01-30 ENCOUNTER — Encounter: Payer: Self-pay | Admitting: Gastroenterology

## 2021-01-31 ENCOUNTER — Telehealth: Payer: Self-pay

## 2021-01-31 NOTE — Telephone Encounter (Signed)
Kenneth Banister, MD  Timothy Lasso, RN Kenneth [name], can you get in touch with [facility].  Tell him that I think those are both too very good options, I think Humira would be a little easier for him to administer but Remicade is certainly a great option and many people enjoy the time that they spend at the infusion center actually.   Can you check with his insurance provider and see if they have any real preference between either Humira or Remicade new start.  If they cover both then lets go with Remicade.  Let him know I will call him when his blood work is back and after we hear back from his insurance coverage to discuss this further.   He needs to contact his primary care provider about pneumococcal vaccination and make sure he is up-to-date on influenza vaccination as well.   Thanks

## 2021-01-31 NOTE — Telephone Encounter (Signed)
Can you please check with insurance and let me know which biologic is covered?  Thanks so much

## 2021-02-01 ENCOUNTER — Encounter: Payer: Self-pay | Admitting: Gastroenterology

## 2021-02-01 NOTE — Telephone Encounter (Signed)
So we are going with Remicade. Where should I send him for that?

## 2021-02-01 NOTE — Telephone Encounter (Signed)
Contact the infusion center on [address]

## 2021-02-01 NOTE — Addendum Note (Signed)
Addended by: Timothy Lasso on: 02/01/2021 04:11 PM   Modules accepted: Orders

## 2021-02-01 NOTE — Telephone Encounter (Signed)
Ok, either way, it's not cheap but I think Humira would be the cheaper of the two.  I think you guys fax [facility]? still for those. Do what ever you do for other patients that are on Humira.  Remicaide - goes against deductible ($350) and co-ins of 80%. Out of pocket max is 7900. Humira - per [employer] [phone] they said it is $250 copay for quantity of 2 per 30 days.

## 2021-02-01 NOTE — Telephone Encounter (Signed)
[name], You guys typically handle Humira, so I need a J code for that and what is the typical administration schedule.  After I have that, I can call for the benefits and let you know what the difference is between the two.

## 2021-02-01 NOTE — Telephone Encounter (Signed)
We never have to enter a J code so I do not have that information.  I looked online and got this; J0135 Strength: 40 MG/0.8 ML Kit.   ° °Humira is 160 mg day 1, 80 mg day 15, then 40 mg every other week.   °

## 2021-02-01 NOTE — Telephone Encounter (Signed)
Dr Ardis Hughs the pt is ok with Remicade.  Please advise on dosing.  5 mg/kg or 10 mg/kg?

## 2021-02-01 NOTE — Telephone Encounter (Signed)
The order for Remicade has been entered to be done at the [facility].  The pt has been advised to call if he has not heard from that office in 1 week or if there is a problem with cost.

## 2021-02-02 ENCOUNTER — Telehealth: Payer: Self-pay | Admitting: Pharmacy Technician

## 2021-02-02 ENCOUNTER — Other Ambulatory Visit: Payer: 59

## 2021-02-02 DIAGNOSIS — K51919 Ulcerative colitis, unspecified with unspecified complications: Secondary | ICD-10-CM | POA: Diagnosis not present

## 2021-02-02 NOTE — Telephone Encounter (Addendum)
Auth Submission: PENDING Payer: [employer] Medication & CPT/J Code(s) submitted: Remicade (Infliximab) [id number] Route of submission (phone, fax, portal): PORTAL Auth type: Buy/Bill Units/visits requested: 5MG /KG Reference number: [id number] CASE ID# [id number] [facility] ID# [id number] GR# [id number] PHONE: [phone]  Will update once we receive a response.

## 2021-02-07 LAB — QUANTIFERON-TB GOLD PLUS
Mitogen-NIL: 5.26 IU/mL
NIL: 0.04 IU/mL
QuantiFERON-TB Gold Plus: NEGATIVE
TB1-NIL: <0 IU/mL
TB2-NIL: 0 IU/mL

## 2021-02-07 LAB — HEPATITIS C ANTIBODY
Hepatitis C Ab: NONREACTIVE
SIGNAL TO CUT-OFF: <0.02 (ref <1.00)

## 2021-02-07 LAB — HEPATITIS B SURFACE ANTIGEN: Hepatitis B Surface Ag: NONREACTIVE

## 2021-02-07 LAB — HEPATITIS B SURFACE ANTIBODY,QUALITATIVE: Hep B S Ab: REACTIVE — AB

## 2021-02-12 ENCOUNTER — Other Ambulatory Visit: Payer: Self-pay | Admitting: Pharmacy Technician

## 2021-02-14 NOTE — Telephone Encounter (Signed)
F/u: remicade PA still pending.

## 2021-02-19 NOTE — Telephone Encounter (Signed)
F/u: remicade. Still pending: requested supervisor due to past the window of review. PA has been expedited as urgent! Rep# [id number] 02/19/21

## 2021-02-19 NOTE — Telephone Encounter (Signed)
Dr. Ardis Hughs,  Auth Submission: DENIED (remicade) Payer: [employer] Medication & CPT/J Code(s) submitted: Remicade (Infliximab) [id number] Route of submission (phone, fax, portal): PORTAL Auth type: Buy/Bill Units/visits requested:5MG /KG  Reference number: [id number]  Denied due to preferred product is Avsola. [phone]) Would you like to try Avsola???  Please re-enter therapy plan if agreeable and we will schedule the patient as soon as possible. Kim.

## 2021-02-20 ENCOUNTER — Encounter: Payer: Self-pay | Admitting: Gastroenterology

## 2021-02-20 NOTE — Addendum Note (Signed)
Addended by: Jonelle Sidle E on: 02/20/2021 10:01 AM   Modules accepted: Orders

## 2021-02-20 NOTE — Telephone Encounter (Signed)
Auth Submission: approved Payer: [employer] Medication & CPT/J Code(s) submitted: AVSOLA Route of submission (phone, fax, portal): PORTAL Auth type: Buy/Bill Units/visits requested: 5MG /KG Reference number: [id number] Approval from: 02/02/21 to 08/02/21   FYI. Per approval states 1 unit = confirmed/verified approved for 5mg /kg d1, d12, d42, then q8wks REP: Jearld Lesch

## 2021-02-21 ENCOUNTER — Ambulatory Visit: Payer: 59 | Admitting: Gastroenterology

## 2021-02-21 ENCOUNTER — Encounter: Payer: Self-pay | Admitting: Gastroenterology

## 2021-02-21 ENCOUNTER — Other Ambulatory Visit (INDEPENDENT_AMBULATORY_CARE_PROVIDER_SITE_OTHER): Payer: 59

## 2021-02-21 VITALS — BP 130/70 | HR 68 | Ht 73.0 in | Wt 171.0 lb

## 2021-02-21 DIAGNOSIS — K51919 Ulcerative colitis, unspecified with unspecified complications: Secondary | ICD-10-CM | POA: Diagnosis not present

## 2021-02-21 LAB — SEDIMENTATION RATE: Sed Rate: 37 mm/hr — ABNORMAL HIGH (ref 0–15)

## 2021-02-21 NOTE — Progress Notes (Signed)
Review of pertinent gastrointestinal problems:   1. Left sided UC: Colonoscopy Dr. Penelope Coop 2006 up to 30cm. Mesalamine products did not seem to help very well. Significant flare summer 2009 steroids started. Had extreme hyperglycemia on steroids requiring insulin. Repeat colonoscopy December, 2009 showed inflammation moderate To 70 cm. He was started on sulfasalazine. Had drug rash, likely pneumonitis, LFT abnormalities ( transaminases up to almost 1000, bili up to 6). Holding his medicines resulted in complete resolution of abnormal labs checked serially. TPMT testing January, 2010 showed normal enzyme activity. February, 2010 tapered off all medicines and feels very well. May, 2010: flaring of symptoms for the past month, He went to see a [place] herbalist, did not start the oral mesalamine that I recommended, restarted prednisone. September, 2010 self-medicating with prednisone however tapering off. January, 2011, : doing well off traditional IBD medicines, taking Chinese Herb. 05/2011 severe flare, (abd pain, weight loss, bloody diarrhea Hb 6s), required admission, iv steroids, blood transfusion. October 2014: flare of symptoms (had stopped mesalamine 4 months prior); 01/2013 flare improved with prednisone and restarting lialda. Flared 06/2014, prednisone 10m BID. OV 10/2016 doing well on full dose lialda.  Colonoscopy November 2019 for chronic colitis showed normal terminal ileum, colonic mucosa was abnormal throughout.  To have mild active inflammation in the rectosigmoid.  And burned-out inflammation throughout remaining segments.  Multiple pseudopolyps throughout.  Colon was biopsied extensively.  Biopsies showed no sign of dysplasia and no active or chronic inflammation.  I colonoscopy December 2022 normal terminal ileum, mild inflammation and pseudopolyps throughout the colon as well as 20 cm segment of circumferentially severe colitis around the splenic flexure multiple biopsies taken: Right and left colon  biopsies showed chronic damage of the colon, no active inflammation, but the 20 cm segment of severe colitis showed moderately active chronic inflammation. Decided to initiate biologic therapy December 2022 January 2023 quant gold TB test negative, hepatitis C antibody nonreactive, hepatitis B testing suggested immunity Ordered infliximab 5 mg/kg usual loading protocol, his insurance company preferred Avsola rather than Remicade  2. swollen, somewhat thrombosed external hemorrhoid July 2013   HPI: This is a very pleasant 46year old woman whom I last saw the time of colonoscopy about 2 months ago.  See the results summarized above  He feels fine.  He has 1 or 2 solid bowel movements daily.  No bleeding.  No abdominal pains.    ROS: complete GI ROS as described in HPI, all other review negative.  Constitutional:  No unintentional weight loss   Past Medical History:  Diagnosis Date   Blood transfusion without reported diagnosis    Colitis    [place]'s disease ([facility]    Diabetes mellitus without complication ([facility]    Steroid-induced hyperglycemia     Past Surgical History:  Procedure Laterality Date   [place]  HERNIA REPAIR  2010   left   left thumb surgery  2000   WISDOM TOOTH EXTRACTION  11/2010    Current Outpatient Medications  Medication Sig Dispense Refill   Cholecalciferol 125 MCG (5000 UT) TABS Take 1 tablet by mouth daily.     Continuous Blood Gluc Sensor (DEXCOM G6 SENSOR) MISC Use as directed and change sensor every 10 days 3 each 11   Continuous Blood Gluc Transmit (DEXCOM G6 TRANSMITTER) MISC Use as directed. 1 each 3   insulin degludec (TRESIBA FLEXTOUCH) 100 UNIT/ML FlexTouch Pen Inject 23 units (titrate as directed, max daily dose of 50 units) under the skin  once a day 45 mL 3   insulin lispro (HUMALOG KWIKPEN) 100 UNIT/ML KwikPen Inject 5 units for breakfast and 5 units for lunches and 8 to 10 units for evening meal subcutaneous four  times a day 90 days 15 mL 11   Insulin Pen Needle (ULTICARE MICRO PEN NEEDLES) 32G X 4 MM MISC Use as directed to administer insulin 3 times a day 100 each 11   Insulin Syringe-Needle U-100 30G X 5/16" 0.3 ML MISC USE 4 TIMES A DAY WITH NOVOLOG AND LANTUS     Isopropyl Alcohol (ALCOHOL WIPES) 70 % MISC See admin instructions.     mesalamine (LIALDA) 1.2 g EC tablet Take 4 tablets (4.8 g total) by mouth daily. 120 tablet 9   OVER THE COUNTER MEDICATION CBD Oil.  As needed daily.     TURMERIC PO Take 3 g by mouth daily.     No current facility-administered medications for this visit.    Allergies as of 02/21/2021 - Review Complete 02/21/2021  Allergen Reaction Noted   Clomiphene  09/30/2019   Moxifloxacin Other (See Comments)    Sulfonamide derivatives Other (See Comments)     Family History  Problem Relation Age of Onset   GER disease Father    Hyperlipidemia Maternal Grandmother    Stroke Maternal Grandmother    Heart disease Paternal Grandfather    Heart attack Paternal Grandfather    Colon cancer Neg Hx    Rectal cancer Neg Hx    Stomach cancer Neg Hx    Pancreatic cancer Neg Hx    Esophageal cancer Neg Hx     Social History   Socioeconomic History   Marital status: Married    Spouse name: Not on file   Number of children: 0   Years of education: Not on file   Highest education level: Not on file  Occupational History   Occupation: artist  Tobacco Use   Smoking status: Former    Types: Cigarettes    Quit date: 07/01/2004    Years since quitting: 16.6   Smokeless tobacco: Never  Vaping Use   Vaping Use: Never used  Substance and Sexual Activity   Alcohol use: Yes    Alcohol/week: 14.0 standard drinks    Types: 14 Glasses of wine per week   Drug use: No   Sexual activity: Yes    Partners: Female  Other Topics Concern   Not on file  Social History Narrative   Works as an Training and development officer (Conservation officer, [employer])   Married for 15 years.   No children   Chickens (egg  laying)   Enjoys hiking, spending time with his wife, reading/games   Social Determinants of Health   Financial Resource Strain: Not on file  Food Insecurity: Not on file  Transportation Needs: Not on file  Physical Activity: Not on file  Stress: Not on file  Social Connections: Not on file  Intimate Partner Violence: Not on file     Physical Exam: BP 130/70    Pulse 68    Ht 6' 1"  (1.854 m)    Wt 171 lb (77.6 kg)    BMI 22.56 kg/m  Constitutional: generally well-appearing Psychiatric: alert and oriented x3 Abdomen: soft, nontender, nondistended, no obvious ascites, no peritoneal signs, normal bowel sounds No peripheral edema noted in lower extremities  Assessment and plan: 46 y.o. male with extensive ulcerative colitis with segment of severe inflammation  We discussed starting Biologics again.  His insurance company prefers Avsola or Remicade  which are biosimilar's, both infliximab chemicals.  He is understandably a bit leery about starting increased medicine since he feels absolutely fine.  He does understand how his colon looked quite diseased however.  I am going to get some inflammatory markers including sed rate, CRP, fecal calprotectin and I am going to discuss his case with one of my colleagues here about going ahead with Biologics, potentially instead starting immunomodulators or other therapy.  Please see the "Patient Instructions" section for addition details about the plan.  Owens Loffler, MD [facility] 02/21/2021, 9:06 AM   Total time on date of encounter was 30 minutes (this included time spent preparing to see the patient reviewing records; obtaining and/or reviewing separately obtained history; performing a medically appropriate exam and/or evaluation; counseling and educating the patient and family if present; ordering medications, tests or procedures if applicable; and documenting clinical information in the health record).

## 2021-02-21 NOTE — Patient Instructions (Addendum)
Your provider has requested that you go to the basement level for lab work before leaving today. Press "B" on the elevator. The lab is located at the first door on the left as you exit the elevator.  Due to recent changes in healthcare laws, you may see the results of your imaging and laboratory studies on MyChart before your provider has had a chance to review them.  We understand that in some cases there may be results that are confusing or concerning to you. Not all laboratory results come back in the same time frame and the provider may be waiting for multiple results in order to interpret others.  Please give [place] 48 hours in order for your provider to thoroughly review all the results before contacting the office for clarification of your results.   Continue with plans for Avsola.  Please follow up in 3 months.  It was a pleasure to see you today!  Thank you for trusting me with your gastrointestinal care!

## 2021-02-22 LAB — HIGH SENSITIVITY CRP: CRP, High Sensitivity: 3.6 mg/L (ref 0.000–5.000)

## 2021-02-23 ENCOUNTER — Other Ambulatory Visit: Payer: 59

## 2021-02-23 DIAGNOSIS — K51919 Ulcerative colitis, unspecified with unspecified complications: Secondary | ICD-10-CM

## 2021-02-27 ENCOUNTER — Other Ambulatory Visit (HOSPITAL_BASED_OUTPATIENT_CLINIC_OR_DEPARTMENT_OTHER): Payer: Self-pay

## 2021-02-28 LAB — CALPROTECTIN, FECAL: Calprotectin, Fecal: 204 ug/g — ABNORMAL HIGH (ref 0–120)

## 2021-03-02 ENCOUNTER — Encounter: Payer: Self-pay | Admitting: Gastroenterology

## 2021-03-05 ENCOUNTER — Other Ambulatory Visit: Payer: Self-pay

## 2021-03-05 ENCOUNTER — Other Ambulatory Visit (HOSPITAL_BASED_OUTPATIENT_CLINIC_OR_DEPARTMENT_OTHER): Payer: Self-pay

## 2021-03-05 DIAGNOSIS — K51919 Ulcerative colitis, unspecified with unspecified complications: Secondary | ICD-10-CM

## 2021-03-05 MED ORDER — AZATHIOPRINE 100 MG PO TABS
200.0000 mg | ORAL_TABLET | Freq: Every day | ORAL | 3 refills | Status: DC
  Filled 2021-03-05: qty 28, 14d supply, fill #0
  Filled 2021-03-19: qty 28, 14d supply, fill #1

## 2021-03-06 ENCOUNTER — Other Ambulatory Visit (HOSPITAL_BASED_OUTPATIENT_CLINIC_OR_DEPARTMENT_OTHER): Payer: Self-pay

## 2021-03-08 ENCOUNTER — Other Ambulatory Visit (HOSPITAL_BASED_OUTPATIENT_CLINIC_OR_DEPARTMENT_OTHER): Payer: Self-pay

## 2021-03-09 ENCOUNTER — Other Ambulatory Visit (HOSPITAL_BASED_OUTPATIENT_CLINIC_OR_DEPARTMENT_OTHER): Payer: Self-pay

## 2021-03-13 ENCOUNTER — Other Ambulatory Visit (HOSPITAL_BASED_OUTPATIENT_CLINIC_OR_DEPARTMENT_OTHER): Payer: Self-pay

## 2021-03-14 ENCOUNTER — Other Ambulatory Visit (HOSPITAL_BASED_OUTPATIENT_CLINIC_OR_DEPARTMENT_OTHER): Payer: Self-pay

## 2021-03-19 ENCOUNTER — Other Ambulatory Visit (HOSPITAL_BASED_OUTPATIENT_CLINIC_OR_DEPARTMENT_OTHER): Payer: Self-pay

## 2021-03-20 ENCOUNTER — Other Ambulatory Visit (HOSPITAL_BASED_OUTPATIENT_CLINIC_OR_DEPARTMENT_OTHER): Payer: Self-pay

## 2021-03-20 MED ORDER — INSULIN LISPRO (1 UNIT DIAL) 100 UNIT/ML (KWIKPEN)
PEN_INJECTOR | SUBCUTANEOUS | 3 refills | Status: DC
  Filled 2021-03-20: qty 15, 60d supply, fill #0

## 2021-03-27 ENCOUNTER — Other Ambulatory Visit (HOSPITAL_BASED_OUTPATIENT_CLINIC_OR_DEPARTMENT_OTHER): Payer: Self-pay

## 2021-03-28 ENCOUNTER — Encounter: Payer: Self-pay | Admitting: Gastroenterology

## 2021-03-30 ENCOUNTER — Other Ambulatory Visit (INDEPENDENT_AMBULATORY_CARE_PROVIDER_SITE_OTHER): Payer: 59

## 2021-03-30 DIAGNOSIS — K51919 Ulcerative colitis, unspecified with unspecified complications: Secondary | ICD-10-CM | POA: Diagnosis not present

## 2021-03-30 LAB — CBC WITH DIFFERENTIAL/PLATELET
Basophils Absolute: 0 10*3/uL (ref 0.0–0.1)
Basophils Relative: 0.9 % (ref 0.0–3.0)
Eosinophils Absolute: 0.4 10*3/uL (ref 0.0–0.7)
Eosinophils Relative: 9.4 % — ABNORMAL HIGH (ref 0.0–5.0)
HCT: 32.8 % — ABNORMAL LOW (ref 39.0–52.0)
Hemoglobin: 10.8 g/dL — ABNORMAL LOW (ref 13.0–17.0)
Lymphocytes Relative: 41.6 % (ref 12.0–46.0)
Lymphs Abs: 1.7 10*3/uL (ref 0.7–4.0)
MCHC: 32.9 g/dL (ref 30.0–36.0)
MCV: 87.7 fl (ref 78.0–100.0)
Monocytes Absolute: 0.4 10*3/uL (ref 0.1–1.0)
Monocytes Relative: 9.7 % (ref 3.0–12.0)
Neutro Abs: 1.6 10*3/uL (ref 1.4–7.7)
Neutrophils Relative %: 38.4 % — ABNORMAL LOW (ref 43.0–77.0)
Platelets: 322 10*3/uL (ref 150.0–400.0)
RBC: 3.74 Mil/uL — ABNORMAL LOW (ref 4.22–5.81)
RDW: 17.9 % — ABNORMAL HIGH (ref 11.5–15.5)
WBC: 4.1 10*3/uL (ref 4.0–10.5)

## 2021-03-30 LAB — COMPREHENSIVE METABOLIC PANEL
ALT: 18 U/L (ref 0–53)
AST: 26 U/L (ref 0–37)
Albumin: 4.1 g/dL (ref 3.5–5.2)
Alkaline Phosphatase: 57 U/L (ref 39–117)
BUN: 13 mg/dL (ref 6–23)
CO2: 28 mEq/L (ref 19–32)
Calcium: 9.2 mg/dL (ref 8.4–10.5)
Chloride: 97 mEq/L (ref 96–112)
Creatinine, Ser: 0.78 mg/dL (ref 0.40–1.50)
GFR: 108.7 mL/min (ref >60.00)
Glucose, Bld: 155 mg/dL — ABNORMAL HIGH (ref 70–99)
Potassium: 4.1 mEq/L (ref 3.5–5.1)
Sodium: 134 mEq/L — ABNORMAL LOW (ref 135–145)
Total Bilirubin: 0.4 mg/dL (ref 0.2–1.2)
Total Protein: 7.2 g/dL (ref 6.0–8.3)

## 2021-03-30 LAB — LIPASE: Lipase: 32 U/L (ref 11.0–59.0)

## 2021-04-25 ENCOUNTER — Other Ambulatory Visit (HOSPITAL_BASED_OUTPATIENT_CLINIC_OR_DEPARTMENT_OTHER): Payer: Self-pay

## 2021-05-01 ENCOUNTER — Other Ambulatory Visit (HOSPITAL_BASED_OUTPATIENT_CLINIC_OR_DEPARTMENT_OTHER): Payer: Self-pay

## 2021-05-01 DIAGNOSIS — E099 Drug or chemical induced diabetes mellitus without complications: Secondary | ICD-10-CM | POA: Diagnosis not present

## 2021-05-01 DIAGNOSIS — Z794 Long term (current) use of insulin: Secondary | ICD-10-CM | POA: Diagnosis not present

## 2021-05-02 ENCOUNTER — Other Ambulatory Visit (HOSPITAL_BASED_OUTPATIENT_CLINIC_OR_DEPARTMENT_OTHER): Payer: Self-pay

## 2021-05-07 ENCOUNTER — Encounter: Payer: Self-pay | Admitting: Gastroenterology

## 2021-05-07 ENCOUNTER — Other Ambulatory Visit (INDEPENDENT_AMBULATORY_CARE_PROVIDER_SITE_OTHER): Payer: 59

## 2021-05-07 ENCOUNTER — Ambulatory Visit: Payer: 59 | Admitting: Gastroenterology

## 2021-05-07 VITALS — BP 140/70 | HR 99 | Resp 76 | Ht 73.0 in | Wt 174.0 lb

## 2021-05-07 DIAGNOSIS — K51919 Ulcerative colitis, unspecified with unspecified complications: Secondary | ICD-10-CM

## 2021-05-07 LAB — CBC WITH DIFFERENTIAL/PLATELET
Basophils Absolute: 0 10*3/uL (ref 0.0–0.1)
Basophils Relative: 0.5 % (ref 0.0–3.0)
Eosinophils Absolute: 0.2 10*3/uL (ref 0.0–0.7)
Eosinophils Relative: 3.6 % (ref 0.0–5.0)
HCT: 39.9 % (ref 39.0–52.0)
Hemoglobin: 13.3 g/dL (ref 13.0–17.0)
Lymphocytes Relative: 22.1 % (ref 12.0–46.0)
Lymphs Abs: 1.4 10*3/uL (ref 0.7–4.0)
MCHC: 33.3 g/dL (ref 30.0–36.0)
MCV: 91.3 fl (ref 78.0–100.0)
Monocytes Absolute: 0.6 10*3/uL (ref 0.1–1.0)
Monocytes Relative: 9 % (ref 3.0–12.0)
Neutro Abs: 4.1 10*3/uL (ref 1.4–7.7)
Neutrophils Relative %: 64.8 % (ref 43.0–77.0)
Platelets: 226 10*3/uL (ref 150.0–400.0)
RBC: 4.37 Mil/uL (ref 4.22–5.81)
RDW: 19 % — ABNORMAL HIGH (ref 11.5–15.5)
WBC: 6.4 10*3/uL (ref 4.0–10.5)

## 2021-05-07 LAB — COMPREHENSIVE METABOLIC PANEL
ALT: 23 U/L (ref 0–53)
AST: 32 U/L (ref 0–37)
Albumin: 4.3 g/dL (ref 3.5–5.2)
Alkaline Phosphatase: 57 U/L (ref 39–117)
BUN: 9 mg/dL (ref 6–23)
CO2: 27 mEq/L (ref 19–32)
Calcium: 9.2 mg/dL (ref 8.4–10.5)
Chloride: 100 mEq/L (ref 96–112)
Creatinine, Ser: 0.87 mg/dL (ref 0.40–1.50)
GFR: 105.1 mL/min (ref >60.00)
Glucose, Bld: 221 mg/dL — ABNORMAL HIGH (ref 70–99)
Potassium: 4.4 mEq/L (ref 3.5–5.1)
Sodium: 137 mEq/L (ref 135–145)
Total Bilirubin: 0.6 mg/dL (ref 0.2–1.2)
Total Protein: 7.8 g/dL (ref 6.0–8.3)

## 2021-05-07 NOTE — Progress Notes (Signed)
Review of pertinent gastrointestinal problems:   ?1. Left sided UC: Colonoscopy Dr. Penelope Coop 2006 up to 44cm. Mesalamine products did not seem to help very well. Significant flare summer 2009 steroids started. Had extreme hyperglycemia on steroids requiring insulin. Repeat colonoscopy December, 2009 showed inflammation moderate To 70 cm. He was started on sulfasalazine. Had drug rash, likely pneumonitis, LFT abnormalities ( transaminases up to almost 1000, bili up to 6). Holding his medicines resulted in complete resolution of abnormal labs checked serially. TPMT testing January, 2010 showed normal enzyme activity. February, 2010 tapered off all medicines and feels very well. May, 2010: flaring of symptoms for the past month, He went to see a [place] herbalist, did not start the oral mesalamine that I recommended, restarted prednisone. September, 2010 self-medicating with prednisone however tapering off. January, 2011, : doing well off traditional IBD medicines, taking Chinese Herb. 05/2011 severe flare, (abd pain, weight loss, bloody diarrhea Hb 6s), required admission, iv steroids, blood transfusion. October 2014: flare of symptoms (had stopped mesalamine 4 months prior); 01/2013 flare improved with prednisone and restarting lialda. Flared 06/2014, prednisone 73m BID. OV 10/2016 doing well on full dose lialda.  Colonoscopy November 2019 for chronic colitis showed normal terminal ileum, colonic mucosa was abnormal throughout.  To have mild active inflammation in the rectosigmoid.  And burned-out inflammation throughout remaining segments.  Multiple pseudopolyps throughout.  Colon was biopsied extensively.  Biopsies showed no sign of dysplasia and no active or chronic inflammation.  I colonoscopy December 2022 normal terminal ileum, mild inflammation and pseudopolyps throughout the colon as well as 20 cm segment of circumferentially severe colitis around the splenic flexure multiple biopsies taken: Right and left colon  biopsies showed chronic damage of the colon, no active inflammation, but the 20 cm segment of severe colitis showed moderately active chronic inflammation. ?Decided to initiate biologic therapy December 2022 ?January 2023 quant gold TB test negative, hepatitis C antibody nonreactive, hepatitis B testing suggested immunity ?Ordered infliximab 5 mg/kg usual loading protocol, his insurance company preferred Avsola rather than Remicade ?02/2021 fecal calprotectin 205, CRP elevated, sed rate normal.  Initiated azathioprine 200 mg once daily and he had immediate flulike reaction ?  ?2. swollen, somewhat thrombosed external hemorrhoid July 2013 ? ? ?HPI: ?This is a very pleasant 46year old man ? ?He had a quite clear, severe reaction to azathioprine, flulike reaction.  Achiness, headaches, increased temperatures.  The started shortly after he started the azathioprine and resolved completely within 1 to 2 days of stopping that medicine. ? ?He now feels back to completely normal ? ?He has no issues with his bowels, specifically no abdominal pains, no serious diarrhea, no bleeding.  He has somewhat soft stools once or twice a day. ? ?ROS: complete GI ROS as described in HPI, all other review negative. ? ?Constitutional:  No unintentional weight loss ? ? ?Past Medical History:  ?Diagnosis Date  ? Blood transfusion without reported diagnosis   ? Colitis   ? [place]   ? [place]   ? Steroid-induced hyperglycemia   ? ? ?Past Surgical History:  ?Procedure Laterality Date  ? COLONOSCOPY  2019  ? Dr.Jarrid Lienhard  ? HERNIA REPAIR  2010  ? left  ? left thumb surgery  2000  ? WISDOM TOOTH EXTRACTION  11/2010  ? ? ?Current Outpatient Medications  ?Medication Instructions  ? Cholecalciferol 125 MCG (5000 UT) TABS 1 tablet, Oral, Daily  ? Continuous Blood Gluc Sensor (DEXCOM G6 SENSOR) MISC Use as directed  and change sensor every 10 days  ? Continuous Blood Gluc Transmit (DEXCOM G6 TRANSMITTER)  MISC Use as directed.  ? insulin degludec (TRESIBA FLEXTOUCH) 100 UNIT/ML FlexTouch Pen Inject 23 units (titrate as directed, max daily dose of 50 units) under the skin once a day  ? insulin lispro (HUMALOG KWIKPEN) 100 UNIT/ML KwikPen Inject 5 units for breakfast and 5 units for lunches and 8 to 10 units for evening meal subcutaneous four times a day 90 days  ? Insulin Pen Needle (ULTICARE MICRO PEN NEEDLES) 32G X 4 MM MISC Use as directed to administer insulin 3 times a day  ? Insulin Syringe-Needle U-100 30G X 5/16" 0.3 ML MISC USE 4 TIMES A DAY WITH NOVOLOG AND LANTUS  ? Isopropyl Alcohol (ALCOHOL WIPES) 70 % MISC See admin instructions  ? Lialda 4.8 g, Oral, Daily  ? OVER THE COUNTER MEDICATION CBD Oil.  As needed daily.   ? TURMERIC PO 3 g, Oral, Daily  ? ? ?Allergies as of 05/07/2021 - Review Complete 05/07/2021  ?Allergen Reaction Noted  ? Clomiphene  09/30/2019  ? Moxifloxacin Other (See Comments)   ? Sulfonamide derivatives Other (See Comments)   ? ? ?Family History  ?Problem Relation Age of Onset  ? GER disease Father   ? Hyperlipidemia Maternal Grandmother   ? Stroke Maternal Grandmother   ? Heart disease Paternal Grandfather   ? Heart attack Paternal Grandfather   ? Colon cancer Neg Hx   ? Rectal cancer Neg Hx   ? Stomach cancer Neg Hx   ? Pancreatic cancer Neg Hx   ? Esophageal cancer Neg Hx   ? ? ?Social History  ? ?Socioeconomic History  ? Marital status: Married  ?  Spouse name: Not on file  ? Number of children: 0  ? Years of education: Not on file  ? Highest education level: Not on file  ?Occupational History  ? Occupation: artist  ?Tobacco Use  ? Smoking status: Former  ?  Types: Cigarettes  ?  Quit date: 07/01/2004  ?  Years since quitting: 16.8  ? Smokeless tobacco: Never  ?Vaping Use  ? Vaping Use: Never used  ?Substance and Sexual Activity  ? Alcohol use: Yes  ?  Alcohol/week: 14.0 standard drinks  ?  Types: 14 Glasses of wine per week  ? Drug use: No  ? Sexual activity: Yes  ?  Partners:  Female  ?Other Topics Concern  ? Not on file  ?Social History Narrative  ? Works as an Training and development officer Restaurant manager, fast food)  ? Married for 15 years.  ? No children  ? Chickens (egg laying)  ? Enjoys hiking, spending time with his wife, reading/games  ? ?Social Determinants of Health  ? ?Financial Resource Strain: Not on file  ?Food Insecurity: Not on file  ?Transportation Needs: Not on file  ?Physical Activity: Not on file  ?Stress: Not on file  ?Social Connections: Not on file  ?Intimate Partner Violence: Not on file  ? ? ? ?Physical Exam: ?BP 140/70   Pulse 99   Resp (!) 76   Ht 6' 1"  (1.854 m)   Wt 174 lb (78.9 kg)   BMI 22.96 kg/m?  ?Constitutional: generally well-appearing ?Psychiatric: alert and oriented x3 ?Abdomen: soft, nontender, nondistended, no obvious ascites, no peritoneal signs, normal bowel sounds ?No peripheral edema noted in lower extremities ? ?Assessment and plan: ?46 y.o. male with extensive ulcerative colitis with a segment in the left colon of severe inflammation. ? ?His colon disease  is nearly completely asymptomatic.  He does have soft stools.  His inflammatory markers are elevated.  I tried him on azathioprine and he had a flulike reaction.  Stopping the medicine completely resolved that flulike reaction. ? ?He had a severe reaction to sulfasalazine several years ago.  He had extreme hyperglycemia with prednisone several years ago.  He has now had flulike reaction to immunomodulators. ? ?His colon disease is asymptomatic and given the fact that he has had 3 significant reactions to medicines which I have started I am quite reluctant to start him on another medicine such as biologic or different immune modulators.  He is also reluctant to try another medicine.  We did discuss the fact that chronic inflammation of the colon will increase chances of colon cancer and he completely understands that. ? ?I recommended fiber opinion which she agreed to.  We will arrange referral to Dr. Casey Burkitt; my question is what medicine should we consider trying for his undercontrolled extensive colitis. ? ?Please see the "Patient Instructions" section for addition details about the plan. ? ?Owens Loffler, M

## 2021-05-07 NOTE — Patient Instructions (Addendum)
If you are age 44 or younger, your body mass index should be between 19-25. Your Body mass index is 22.96 kg/m?Marland Kitchen If this is out of the aformentioned range listed, please consider follow up with your Primary Care Provider.  ?________________________________________________________ ? ?The [facility] providers would like to encourage you to use [facility] to communicate with providers for non-urgent requests or questions.  Due to long hold times on the telephone, sending your provider a message by [facility] may be a faster and more efficient way to get a response.  Please allow 48 business hours for a response.  Please remember that this is for non-urgent requests.  ?_______________________________________________________ ? ?Your provider has requested that you go to the basement level for lab work before leaving today. Press "B" on the elevator. The lab is located at the first door on the left as you exit the elevator. ? ?Due to recent changes in healthcare laws, you may see the results of your imaging and laboratory studies on MyChart before your provider has had a chance to review them.  We understand that in some cases there may be results that are confusing or concerning to you. Not all laboratory results come back in the same time frame and the provider may be waiting for multiple results in order to interpret others.  Please give [place] 48 hours in order for your provider to thoroughly review all the results before contacting the office for clarification of your results.  ? ?We have referred you to Dr Renee Harder with [facility].  Please let us know if you have not heard from their office in 1 to 2 weeks. ? ?Thank you for entrusting me with your care and choosing [facility] ? ?Dr Ardis Hughs ? ?

## 2021-05-16 ENCOUNTER — Other Ambulatory Visit (HOSPITAL_BASED_OUTPATIENT_CLINIC_OR_DEPARTMENT_OTHER): Payer: Self-pay

## 2021-05-31 ENCOUNTER — Other Ambulatory Visit (HOSPITAL_BASED_OUTPATIENT_CLINIC_OR_DEPARTMENT_OTHER): Payer: Self-pay

## 2021-06-05 ENCOUNTER — Other Ambulatory Visit (HOSPITAL_BASED_OUTPATIENT_CLINIC_OR_DEPARTMENT_OTHER): Payer: Self-pay

## 2021-06-06 ENCOUNTER — Encounter (INDEPENDENT_AMBULATORY_CARE_PROVIDER_SITE_OTHER): Payer: 59 | Admitting: Nurse Practitioner

## 2021-06-08 ENCOUNTER — Other Ambulatory Visit: Payer: Self-pay | Admitting: Pharmacy Technician

## 2021-07-02 ENCOUNTER — Other Ambulatory Visit (HOSPITAL_BASED_OUTPATIENT_CLINIC_OR_DEPARTMENT_OTHER): Payer: Self-pay

## 2021-07-09 ENCOUNTER — Other Ambulatory Visit (HOSPITAL_BASED_OUTPATIENT_CLINIC_OR_DEPARTMENT_OTHER): Payer: Self-pay

## 2021-07-19 ENCOUNTER — Other Ambulatory Visit (HOSPITAL_BASED_OUTPATIENT_CLINIC_OR_DEPARTMENT_OTHER): Payer: Self-pay

## 2021-07-19 MED ORDER — INSULIN LISPRO (1 UNIT DIAL) 100 UNIT/ML (KWIKPEN)
PEN_INJECTOR | SUBCUTANEOUS | 3 refills | Status: DC
  Filled 2021-07-19: qty 27, 90d supply, fill #0
  Filled 2021-11-23: qty 27, 90d supply, fill #1
  Filled 2022-02-20: qty 27, 90d supply, fill #2
  Filled 2022-05-22: qty 27, 90d supply, fill #3

## 2021-07-24 DIAGNOSIS — Z0000 Encounter for general adult medical examination without abnormal findings: Secondary | ICD-10-CM | POA: Diagnosis not present

## 2021-07-25 ENCOUNTER — Other Ambulatory Visit (HOSPITAL_BASED_OUTPATIENT_CLINIC_OR_DEPARTMENT_OTHER): Payer: Self-pay

## 2021-07-26 ENCOUNTER — Other Ambulatory Visit (HOSPITAL_BASED_OUTPATIENT_CLINIC_OR_DEPARTMENT_OTHER): Payer: Self-pay

## 2021-07-26 MED ORDER — GLUCOSE BLOOD VI STRP
ORAL_STRIP | 2 refills | Status: AC
Start: 2021-07-26 — End: ?
  Filled 2021-07-26: qty 300, 75d supply, fill #0
  Filled 2022-01-10: qty 300, 75d supply, fill #1

## 2021-08-02 ENCOUNTER — Other Ambulatory Visit: Payer: Self-pay | Admitting: Gastroenterology

## 2021-08-02 ENCOUNTER — Other Ambulatory Visit (HOSPITAL_BASED_OUTPATIENT_CLINIC_OR_DEPARTMENT_OTHER): Payer: Self-pay

## 2021-08-02 MED ORDER — MESALAMINE 1.2 G PO TBEC
4.8000 g | DELAYED_RELEASE_TABLET | Freq: Every day | ORAL | 9 refills | Status: DC
  Filled 2021-08-02: qty 120, 30d supply, fill #0
  Filled 2021-09-07: qty 120, 30d supply, fill #1
  Filled 2021-10-09: qty 120, 30d supply, fill #2
  Filled 2021-11-23: qty 120, 30d supply, fill #3
  Filled 2021-12-27: qty 120, 30d supply, fill #4
  Filled 2022-01-23 – 2022-01-24 (×2): qty 120, 30d supply, fill #5
  Filled 2022-02-20: qty 120, 30d supply, fill #6
  Filled 2022-04-09: qty 120, 30d supply, fill #7
  Filled 2022-05-09: qty 120, 30d supply, fill #8
  Filled 2022-06-06: qty 120, 30d supply, fill #9

## 2021-08-08 ENCOUNTER — Other Ambulatory Visit (HOSPITAL_BASED_OUTPATIENT_CLINIC_OR_DEPARTMENT_OTHER): Payer: Self-pay

## 2021-08-08 ENCOUNTER — Emergency Department: Admit: 2021-08-09 | Payer: PRIVATE HEALTH INSURANCE

## 2021-08-08 DIAGNOSIS — I4891 Unspecified atrial fibrillation: Secondary | ICD-10-CM

## 2021-08-08 DIAGNOSIS — I480 Paroxysmal atrial fibrillation: Principal | ICD-10-CM

## 2021-08-08 NOTE — ED Provider Notes (Signed)
[facility]  eMERGENCY dEPARTMENT eNCOUnter      Pt Name: Gary Cherry  MRN: [id number]  Birthdate [birth year]  Date of evaluation: 08/08/2021  Provider: Salome Holmes, MD    CHIEF COMPLAINT       Chief Complaint   Patient presents with    Atrial Fibrillation     Been running approx 150s for 3 days, has hx of this and had to be cardioverted in the past         HISTORY OF PRESENT ILLNESS   (Location/Symptom, Timing/Onset,Context/Setting, Quality, Duration, Modifying Factors, Severity)  Note limiting factors.   GEGORY Cherry is a 46 y.o. male who presents to the emergency department due to palpitations.  Patient is history of atrial fibrillation.  Feels like his been out of rhythm for the past 3 days.  No chest pain or shortness of breath.  Feels very similar to episodes he had before.  Has a history of A-fib since 2012.  Has been cardioverted a few times, most recent cardioversion being several years ago.  Moved to the area about a year ago and does not currently have a cardiologist but has a PCP.  On Eliquis and metoprolol.  Has been compliant with his medication.  No history of DVT or PE.  No unilateral leg swelling or pain.  No chest pain.  No history of coronary disease.    HPI    NursingNotes were reviewed.    REVIEW OF SYSTEMS    (2-9 systems for level 4, 10 or more for level 5)     Review of Systems   Constitutional:  Negative for fever.   Eyes:  Negative for pain.   Respiratory:  Negative for shortness of breath.    Cardiovascular:  Positive for palpitations. Negative for chest pain.   Gastrointestinal:  Negative for abdominal pain, diarrhea and vomiting.   Genitourinary:  Negative for dysuria.   Skin:  Negative for rash.   Neurological:  Negative for weakness and headaches.   All other systems reviewed and are negative.    A complete review of systems was performed and is negative except as noted above in the HPI.       PAST MEDICAL HISTORY     Past Medical History:   Diagnosis Date    Alcoholism  [facility])     Atrial fibrillation (HCC)     2012    Cardiomyopathy (HCC)     Sleep apnea     V-tach (HCC)          SURGICAL HISTORY       Past Surgical History:   Procedure Laterality Date    ABLATION OF DYSRHYTHMIC FOCUS      CArdiac Electrophysiology     APPENDECTOMY      ORTHOPEDIC SURGERY      ACL repair          CURRENT MEDICATIONS       Current Discharge Medication List        CONTINUE these medications which have NOT CHANGED    Details   LISINOPRIL PO Take by mouth      aspirin 81 MG EC tablet Take 81 mg by mouth daily      metoprolol tartrate (LOPRESSOR) 25 MG tablet Take 1 tablet by mouth 2 times daily Take 1/2 tablet in the morning and 1 full tablet at night.      apixaban (ELIQUIS) 5 MG TABS tablet Take 1 tablet by mouth 2 times daily  ALLERGIES     Patient has no known allergies.    FAMILY HISTORY     History reviewed. No pertinent family history.       SOCIAL HISTORY       Social History     Socioeconomic History    Marital status: Divorced     Spouse name: None    Number of children: None    Years of education: None    Highest education level: None   Tobacco Use    Smoking status: Never    Smokeless tobacco: Never   Substance and Sexual Activity    Alcohol use: Not Currently     Comment: approx 1-2 nights a week    Drug use: Never    Sexual activity: Not Currently       SCREENINGS    Glasgow Coma Scale  Eye Opening: Spontaneous  Best Verbal Response: Oriented  Best Motor Response: Obeys commands  Glasgow Coma Scale Score: 15        PHYSICAL EXAM    (up to 7 for level 4, 8 or more for level 5)     ED Triage Vitals   BP Temp Temp Source Pulse Respirations SpO2 Height Weight - Scale   08/08/21 2317 08/08/21 2317 08/08/21 2317 08/08/21 2317 08/08/21 2317 08/08/21 2317 08/08/21 2314 08/08/21 2314   (!) 125/97 97.9 F (36.6 C) Oral (!) 153 18 95 % 5\' 11"  (1.803 m) 285 lb (129.3 kg)       Physical Exam  Vitals reviewed.   Constitutional:       General: He is not in acute distress.     Appearance: He  is well-developed.   HENT:      Head: Normocephalic and atraumatic.   Eyes:      General: No scleral icterus.     Pupils: Pupils are equal, round, and reactive to light.   Neck:      Vascular: No JVD.   Cardiovascular:      Rate and Rhythm: Tachycardia present. Rhythm irregular.      Pulses: Normal pulses.      Heart sounds: Normal heart sounds. No murmur heard.  Pulmonary:      Effort: Pulmonary effort is normal. No respiratory distress.      Breath sounds: Normal breath sounds.   Abdominal:      General: There is no distension.      Palpations: Abdomen is soft.      Tenderness: There is no abdominal tenderness. There is no guarding or rebound.   Musculoskeletal:         General: No tenderness.      Cervical back: Normal range of motion and neck supple.      Right lower leg: No edema.      Left lower leg: No edema.   Skin:     General: Skin is warm and dry.      Capillary Refill: Capillary refill takes less than 2 seconds.   Neurological:      General: No focal deficit present.      Mental Status: He is alert and oriented to person, place, and time.   Psychiatric:         Mood and Affect: Mood normal.         Behavior: Behavior normal.       DIAGNOSTIC RESULTS     EKG: All EKG's are interpreted by the Emergency Department Physician who either signs or Co-signs this chart in the absence of  a cardiologist.    Tachycardia.  Rate 153.  Normal QT.  Borderline ST changes.  No prior EKG for comparison.    RADIOLOGY:   Non-plain film images such as CT, Ultrasound and MRI are read by the radiologist. Plainradiographic images are visualized and preliminarily interpreted by the emergency physician with the below findings:        Interpretation per the Radiologist below, if available at the time of this note:    XR CHEST PORTABLE   Final Result   Within normal limits               ______________________________________    Electronically signed by: Cheri Fowler M.D.   Date:     08/09/2021   Time:    05:38         Chest:  Negative    ED BEDSIDE ULTRASOUND:   Performed by ED Physician - none    LABS:  Labs Reviewed   CBC WITH AUTO DIFFERENTIAL - Abnormal; Notable for the following components:       Result Value    MCH 31.5 (*)     MCHC 37.3 (*)     Monocytes % 12.1 (*)     Monocytes Absolute 1.10 (*)     All other components within normal limits   COMPREHENSIVE METABOLIC PANEL - Abnormal; Notable for the following components:    CO2 21 (*)     Glucose 134 (*)     Total Protein 6.4 (*)     All other components within normal limits   BASIC METABOLIC PANEL W/ REFLEX TO MG FOR LOW K - Abnormal; Notable for the following components:    Glucose 121 (*)     All other components within normal limits   TROPONIN   TSH WITH REFLEX TO FT4   MAGNESIUM   PROTIME-INR   APTT       All other labs were within normal range or not returned as of this dictation.    EMERGENCY DEPARTMENT COURSE and DIFFERENTIALDIAGNOSIS/MDM:   Vitals:    Vitals:    08/09/21 0330 08/09/21 0345 08/09/21 0430 08/09/21 0530   BP:  105/80 119/79 (!) 119/92   Pulse: (!) 150 (!) 148 (!) 148 (!) 148   Resp:  18     Temp:       TempSrc:       SpO2:  98%     Weight:       Height:           MDM  Critical Care  Total time providing critical care: minutes (35)    Patient Progress  Patient progress: stable    Patient is EKG is consistent with A-fib with RVR.  Patient was initially given 19 mg dose of Cardizem without any rate improvement.  Discussed patient's case with on-call cardiologist, Dr. Jacquelyne Balint, who recommended giving repeat dose of Cardizem 20 mg.  After repeat Cardizem patient's heart rate is improving down to about 110 but occasionally going back up to 150s.  Cardizem drip ordered.  Case discussed with Dr. Noralee Stain, hospitalist, agreeable plan of care and admission.  Patient resting comfortably updated about plan.     CONSULTS:  IP CONSULT TO CARDIOLOGY  IP CONSULT TO CARDIOLOGY    PROCEDURES:  Unless otherwise notedbelow, none     Procedures    FINAL IMPRESSION     1. Atrial  fibrillation with RVR (HCC)          DISPOSITION/PLAN   DISPOSITION  Admitted 08/09/2021 01:34:24 AM      PATIENT REFERRED TO:  @FUP @    DISCHARGE MEDICATIONS:  Current Discharge Medication List             (Please note that portions of this note were completed with a voice recognition program.  Efforts were made to edit the dictations butoccasionally words are mis-transcribed.)    Salome Holmes, MD (electronically signed)  AttendingEmergency Physician          Salome Holmes, MD  08/09/21 [phone]

## 2021-08-09 ENCOUNTER — Inpatient Hospital Stay
Admission: EM | Admit: 2021-08-09 | Discharge: 2021-08-09 | Disposition: A | Discharge status: Home / Self Care | Payer: PRIVATE HEALTH INSURANCE | Admitting: Internal Medicine

## 2021-08-09 ENCOUNTER — Inpatient Hospital Stay: Admit: 2021-08-09 | Payer: PRIVATE HEALTH INSURANCE

## 2021-08-09 DIAGNOSIS — I4891 Unspecified atrial fibrillation: Secondary | ICD-10-CM

## 2021-08-09 LAB — TROPONIN
Troponin: <0.01 ng/mL (ref 0.00–0.03)
Troponin: <0.01 ng/mL (ref 0.00–0.03)

## 2021-08-09 LAB — EKG 12-LEAD
Q-T Interval: 282 ms
QRS Duration: 102 ms
QTc Calculation (Bazett): 444 ms
T Axis: -78 degrees

## 2021-08-09 LAB — COMPREHENSIVE METABOLIC PANEL
ALT: 35 U/L (ref 5–41)
AST: 18 U/L (ref 5–40)
Albumin: 4.4 g/dL (ref 3.5–5.2)
Alkaline Phosphatase: 97 U/L (ref 40–130)
Anion Gap: 13 mmol/L (ref 7–19)
BUN: 20 mg/dL (ref 6–20)
CO2: 21 mmol/L — ABNORMAL LOW (ref 22–29)
Calcium: 9.2 mg/dL (ref 8.6–10.0)
Chloride: 103 mmol/L (ref 98–111)
Creatinine: 1.1 mg/dL (ref 0.5–1.2)
Est, Glom Filt Rate: >60 (ref >60)
Glucose: 134 mg/dL — ABNORMAL HIGH (ref 74–109)
Potassium: 3.9 mmol/L (ref 3.5–5.0)
Sodium: 137 mmol/L (ref 136–145)
Total Bilirubin: 0.4 mg/dL (ref 0.2–1.2)
Total Protein: 6.4 g/dL — ABNORMAL LOW (ref 6.6–8.7)

## 2021-08-09 LAB — BASIC METABOLIC PANEL W/ REFLEX TO MG FOR LOW K
Anion Gap: 12 mmol/L (ref 7–19)
BUN: 18 mg/dL (ref 6–20)
CO2: 22 mmol/L (ref 22–29)
Calcium: 8.8 mg/dL (ref 8.6–10.0)
Chloride: 105 mmol/L (ref 98–111)
Creatinine: 1.2 mg/dL (ref 0.5–1.2)
Est, Glom Filt Rate: >60 (ref >60)
Glucose: 121 mg/dL — ABNORMAL HIGH (ref 74–109)
Potassium reflex Magnesium: 4.4 mmol/L (ref 3.5–5.0)
Sodium: 139 mmol/L (ref 136–145)

## 2021-08-09 LAB — CBC WITH AUTO DIFFERENTIAL
Basophils %: 0.5 % (ref 0.0–1.0)
Basophils Absolute: 0.1 10*3/uL (ref 0.00–0.20)
Eosinophils %: 1.5 % (ref 0.0–5.0)
Eosinophils Absolute: 0.1 10*3/uL (ref 0.00–0.60)
Hematocrit: 46.7 % (ref 42.0–52.0)
Hemoglobin: 17.4 g/dL (ref 14.0–18.0)
Immature Granulocytes #: 0.1 10*3/uL
Lymphocytes %: 26.7 % (ref 20.0–40.0)
Lymphocytes Absolute: 2.5 10*3/uL (ref 1.1–4.5)
MCH: 31.5 pg — ABNORMAL HIGH (ref 27.0–31.0)
MCHC: 37.3 g/dL — ABNORMAL HIGH (ref 33.0–37.0)
MCV: 84.6 fL (ref 80.0–94.0)
MPV: 10.2 fL (ref 9.4–12.4)
Monocytes %: 12.1 % — ABNORMAL HIGH (ref 0.0–10.0)
Monocytes Absolute: 1.1 10*3/uL — ABNORMAL HIGH (ref 0.00–0.90)
Neutrophils %: 58.5 % (ref 50.0–65.0)
Neutrophils Absolute: 5.4 10*3/uL (ref 1.5–7.5)
Platelets: 256 10*3/uL (ref 130–400)
RBC: 5.52 M/uL (ref 4.70–6.10)
RDW: 12.6 % (ref 11.5–14.5)
WBC: 9.2 10*3/uL (ref 4.8–10.8)

## 2021-08-09 LAB — PROTIME-INR
INR: 1.07 (ref 0.88–1.18)
Protime: 13.6 s (ref 12.0–14.6)

## 2021-08-09 LAB — BRAIN NATRIURETIC PEPTIDE: Pro-BNP: 1893 pg/mL — ABNORMAL HIGH (ref 0–124)

## 2021-08-09 LAB — MAGNESIUM: Magnesium: 1.9 mg/dL (ref 1.6–2.6)

## 2021-08-09 LAB — APTT: aPTT: 28.9 s (ref 26.0–36.2)

## 2021-08-09 LAB — TSH WITH REFLEX TO FT4: TSH Reflex FT4: 3.64 u[IU]/mL (ref 0.35–5.50)

## 2021-08-09 LAB — D-DIMER, QUANTITATIVE: D-Dimer, Quant: <0.27 ug/mL FEU (ref 0.00–0.48)

## 2021-08-09 MED ORDER — NORMAL SALINE FLUSH 0.9 % IV SOLN
0.9 % | INTRAVENOUS | Status: AC | PRN
Start: 2021-08-09 — End: 2021-08-10

## 2021-08-09 MED ORDER — SODIUM CHLORIDE 0.9 % IV SOLN
0.9 % | INTRAVENOUS | Status: AC
Start: 2021-08-09 — End: 2021-08-10

## 2021-08-09 MED ORDER — ONDANSETRON HCL 4 MG/2ML IJ SOLN
4 MG/2ML | Freq: Four times a day (QID) | INTRAMUSCULAR | Status: AC | PRN
Start: 2021-08-09 — End: 2021-08-10

## 2021-08-09 MED ORDER — ONDANSETRON 4 MG PO TBDP
4 MG | Freq: Three times a day (TID) | ORAL | Status: AC | PRN
Start: 2021-08-09 — End: 2021-08-10

## 2021-08-09 MED ORDER — SODIUM CHLORIDE 0.9 % IV SOLN
0.9 % | INTRAVENOUS | Status: AC | PRN
Start: 2021-08-09 — End: 2021-08-10

## 2021-08-09 MED ORDER — SODIUM CHLORIDE 0.9 % IV BOLUS
0.9 % | Freq: Once | INTRAVENOUS | Status: AC
Start: 2021-08-09 — End: 2021-08-09
  Administered 2021-08-09: 06:00:00 250 mL via INTRAVENOUS

## 2021-08-09 MED ORDER — APIXABAN 5 MG PO TABS
5 MG | Freq: Two times a day (BID) | ORAL | Status: AC
Start: 2021-08-09 — End: 2021-08-10
  Administered 2021-08-09: 13:00:00 5 mg via ORAL

## 2021-08-09 MED ORDER — DILTIAZEM HCL 25 MG/5ML IV SOLN
25 MG/5ML | Freq: Once | INTRAVENOUS | Status: AC
Start: 2021-08-09 — End: 2021-08-08
  Administered 2021-08-09: 04:00:00 19 mg/kg via INTRAVENOUS

## 2021-08-09 MED ORDER — ASPIRIN 81 MG PO TBEC
81 MG | Freq: Every day | ORAL | Status: AC
Start: 2021-08-09 — End: 2021-08-10
  Administered 2021-08-09: 13:00:00 81 mg via ORAL

## 2021-08-09 MED ORDER — FENTANYL CITRATE PF 50 MCG/ML IJ SOSY
50 MCG/ML | INTRAMUSCULAR | Status: AC
Start: 2021-08-09 — End: ?

## 2021-08-09 MED ORDER — DILTIAZEM HCL 25 MG/5ML IV SOLN
25 MG/5ML | Freq: Once | INTRAVENOUS | Status: AC
Start: 2021-08-09 — End: 2021-08-09
  Administered 2021-08-09: 04:00:00 20 mg via INTRAVENOUS

## 2021-08-09 MED ORDER — PERFLUTREN LIPID MICROSPHERE IV SUSP
Freq: Once | INTRAVENOUS | Status: AC | PRN
Start: 2021-08-09 — End: 2021-08-09
  Administered 2021-08-09: 12:00:00 1.5 mL via INTRAVENOUS

## 2021-08-09 MED ORDER — ACETAMINOPHEN 325 MG PO TABS
325 | ORAL | Status: DC | PRN
Start: 2021-08-09 — End: 2021-08-10

## 2021-08-09 MED ORDER — METOPROLOL TARTRATE 25 MG PO TABS
25 MG | Freq: Every evening | ORAL | Status: AC
Start: 2021-08-09 — End: 2021-08-10

## 2021-08-09 MED ORDER — NORMAL SALINE FLUSH 0.9 % IV SOLN
0.9 % | Freq: Two times a day (BID) | INTRAVENOUS | Status: AC
Start: 2021-08-09 — End: 2021-08-10
  Administered 2021-08-09: 13:00:00 10 mL via INTRAVENOUS

## 2021-08-09 MED ORDER — ACETAMINOPHEN 650 MG RE SUPP
650 | RECTAL | Status: DC | PRN
Start: 2021-08-09 — End: 2021-08-10

## 2021-08-09 MED ORDER — METOPROLOL TARTRATE 25 MG PO TABS
25 MG | Freq: Every day | ORAL | Status: AC
Start: 2021-08-09 — End: 2021-08-10
  Administered 2021-08-09: 13:00:00 12.5 mg via ORAL

## 2021-08-09 MED ORDER — SODIUM CHLORIDE 0.9 % IV SOLN
0.9 | INTRAVENOUS | Status: DC
Start: 2021-08-09 — End: 2021-08-10
  Administered 2021-08-09 (×2): 5 mg/h via INTRAVENOUS

## 2021-08-09 MED ORDER — MIDAZOLAM HCL 2 MG/2ML IJ SOLN
2 MG/ML | INTRAMUSCULAR | Status: AC
Start: 2021-08-09 — End: ?

## 2021-08-09 MED ORDER — LISINOPRIL 2.5 MG PO TABS
2.5 MG | Freq: Every day | ORAL | Status: AC
Start: 2021-08-09 — End: 2021-08-10
  Administered 2021-08-09: 13:00:00 2.5 mg via ORAL

## 2021-08-09 MED FILL — DILTIAZEM HCL 125 MG/25ML IV SOLN: 125 MG/25ML | INTRAVENOUS | Qty: 125

## 2021-08-09 MED FILL — METOPROLOL TARTRATE 25 MG PO TABS: 25 MG | ORAL | Qty: 1

## 2021-08-09 MED FILL — MIDAZOLAM HCL 2 MG/2ML IJ SOLN: 2 MG/ML | INTRAMUSCULAR | Qty: 4

## 2021-08-09 MED FILL — DILTIAZEM HCL 25 MG/5ML IV SOLN: 25 MG/5ML | INTRAVENOUS | Qty: 5

## 2021-08-09 MED FILL — ELIQUIS 5 MG PO TABS: 5 MG | ORAL | Qty: 1

## 2021-08-09 MED FILL — ASPIRIN 81 MG PO TBEC: 81 MG | ORAL | Qty: 1

## 2021-08-09 MED FILL — FENTANYL CITRATE PF 50 MCG/ML IJ SOSY: 50 MCG/ML | INTRAMUSCULAR | Qty: 2

## 2021-08-09 MED FILL — LISINOPRIL 2.5 MG PO TABS: 2.5 MG | ORAL | Qty: 1

## 2021-08-09 NOTE — Consults (Signed)
[facility]  Cardiology Consult      Requesting MD:  Bernette Mayers, MD   Admit Status:         History obtained from:   []  Patient  []  Other (specify):     Patient:  Gary Cherry      Chief Complaint:   Chief Complaint   Patient presents with    Atrial Fibrillation     Been running approx 150s for 3 days, has hx of this and had to be cardioverted in the past         HPI: Gary Cherry is a 46 y.o. male with a history of paroxysmal atrial fibrillation previous ablation January 2012 onset of atrial fibrillation 2011 last episode about 1 year ago.  He is on metoprolol and Eliquis.  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  He tends to drink heavily at times which he recognizes and admits.  History of obstructive sleep apnea.  He exercises sporadically but has not exercise much recently.  Went into abnormal heart rhythm on Monday he thinks denies medical noncompliance denies bleeding issues.  Denies exertional chest pain or dyspnea.  Admitted last evening found to be in atrial fibrillation rapid ventricular sponsor rate troponin negative.  Potassium 4.4 cardiology consulted patient presently NPO.    Review of Systems:  Review of Systems   Constitutional: Negative.  Negative for chills, fever and unexpected weight change.   HENT: Negative.     Eyes: Negative.    Respiratory: Negative.  Negative for shortness of breath.    Cardiovascular: Negative.  Negative for chest pain.   Gastrointestinal: Negative.  Negative for diarrhea, nausea and vomiting.   Endocrine: Negative.    Genitourinary: Negative.    Musculoskeletal: Negative.    Skin: Negative.    Neurological: Negative.    All other systems reviewed and are negative.    Cardiac Specific Data:  Specialty Problems          Cardiology Problems    NSVT (nonsustained ventricular tachycardia) (HCC)        Other restrictive cardiomyopathy (HCC)        Paroxysmal A-fib (HCC)        Persistent atrial fibrillation  (HCC)        * (Principal) Atrial fibrillation with RVR (HCC)           Past Medical History:  Past Medical History:   Diagnosis Date    Alcoholism (HCC)     Atrial fibrillation (HCC)     2012    Cardiomyopathy (HCC)     Sleep apnea     V-tach [facility])         Past Surgical History:  Past Surgical History:   Procedure Laterality Date    ABLATION OF DYSRHYTHMIC FOCUS      CArdiac Electrophysiology     APPENDECTOMY      ORTHOPEDIC SURGERY      ACL repair        Past Family History:  History reviewed. No pertinent family history.    Past Social History:  Social History     Socioeconomic History    Marital status: Divorced     Spouse name: Not on file    Number of children: Not on file    Years of education: Not on file    Highest education level: Not on file   Occupational History    Not on file   Tobacco Use  Smoking status: Never    Smokeless tobacco: Never   Vaping Use    Vaping Use: Not on file   Substance and Sexual Activity    Alcohol use: Not Currently     Comment: approx 1-2 nights a week    Drug use: Never    Sexual activity: Not Currently   Other Topics Concern    Not on file   Social History Narrative    Not on file     Social Determinants of Health     Financial Resource Strain: Not on file   Food Insecurity: Not on file   Transportation Needs: Not on file   Physical Activity: Not on file   Stress: Not on file   Social Connections: Not on file   Intimate Partner Violence: Not on file   Housing Stability: Not on file       Allergies:  No Known Allergies    Home Meds:  Prior to Admission medications    Medication Sig Start Date End Date Taking? Authorizing Provider   LISINOPRIL PO Take by mouth   Yes Historical Provider, MD   aspirin 81 MG EC tablet Take 81 mg by mouth daily  Patient not taking: Reported on 08/08/2021    Historical Provider, MD   metoprolol tartrate (LOPRESSOR) 25 MG tablet Take 1 tablet by mouth 2 times daily Take 1/2 tablet in the morning and 1 full tablet at night.    Historical Provider, MD    apixaban (ELIQUIS) 5 MG TABS tablet Take 1 tablet by mouth 2 times daily    Historical Provider, MD       Current Meds:   aspirin  81 mg Oral Daily    apixaban  5 mg Oral BID    metoprolol tartrate  12.5 mg Oral Daily    metoprolol tartrate  25 mg Oral Nightly    sodium chloride flush  5-40 mL IntraVENous 2 times per day    lisinopril  2.5 mg Oral Daily       Current Infused Meds:   dilTIAZem (CARDIZEM) 125 mg/125 mL infusion 12.5 mg/hr (08/09/21 0536)    sodium chloride      sodium chloride         Physical Exam:  Vitals:    08/09/21 0849   BP:    Pulse: (!) 120   Resp:    Temp:    SpO2:        Intake/Output Summary (Last 24 hours) at 08/09/2021 0925  Last data filed at 08/09/2021 0745  Gross per 24 hour   Intake --   Output 400 ml   Net -400 ml     Estimated body mass index is 39.64 kg/m as calculated from the following:    Height as of this encounter: 5\' 11"  (1.803 m).    Weight as of this encounter: 284 lb 3.2 oz (128.9 kg).        Physical Exam  Vitals reviewed.   Constitutional:       General: He is not in acute distress.     Appearance: Normal appearance. He is well-developed. He is obese. He is not ill-appearing, toxic-appearing or diaphoretic.   HENT:      Head: Normocephalic and atraumatic.      Nose: Nose normal.      Mouth/Throat:      Mouth: Mucous membranes are moist.   Eyes:      General: No scleral icterus.     Extraocular Movements: Extraocular movements  intact.      Pupils: Pupils are equal, round, and reactive to light.   Neck:      Vascular: No carotid bruit or JVD.   Cardiovascular:      Rate and Rhythm: Normal rate and regular rhythm.      Heart sounds: Normal heart sounds. No murmur heard.    No friction rub. No gallop.   Pulmonary:      Effort: Pulmonary effort is normal. No respiratory distress.      Breath sounds: Normal breath sounds. No stridor. No wheezing, rhonchi or rales.   Chest:      Chest wall: No tenderness.   Abdominal:      General: Abdomen is flat. Bowel sounds are normal.  There is no distension.      Palpations: Abdomen is soft. There is no mass.      Tenderness: There is no abdominal tenderness. There is no right CVA tenderness, left CVA tenderness, guarding or rebound.      Hernia: No hernia is present.   Musculoskeletal:         General: No swelling, tenderness, deformity or signs of injury.      Cervical back: Normal range of motion and neck supple. No rigidity or tenderness.      Right lower leg: No edema.      Left lower leg: No edema.   Lymphadenopathy:      Cervical: No cervical adenopathy.   Skin:     General: Skin is warm and dry.   Neurological:      General: No focal deficit present.      Mental Status: He is alert and oriented to person, place, and time. Mental status is at baseline.      Cranial Nerves: No cranial nerve deficit.      Sensory: No sensory deficit.      Motor: No weakness.      Coordination: Coordination normal.   Psychiatric:         Mood and Affect: Mood normal.         Behavior: Behavior normal.         Thought Content: Thought content normal.         Judgment: Judgment normal.       Labs:  Recent Labs     08/08/21  2324   WBC 9.2   HGB 17.4   PLT 256       Recent Labs     08/08/21  2324 08/09/21  0528   NA 137 139   K 3.9 4.4   CL 103 105   CO2 21* 22   BUN 20 18   CREATININE 1.1 1.2   LABGLOM >60 >60   MG 1.9  --    CALCIUM 9.2 8.8       CK, CKMB, Troponin: @LABRCNT  (CKTOTAL:3, CKMB:3, TROPONINI:3)@    Last 3 BNP:  No results for input(s): BNP in the last 72 hours.        IMAGING:  XR CHEST PORTABLE    Result Date: 08/09/2021  EXAM: CHEST, ONE-VIEW    HISTORY:  Tachycardia    FINDINGS:  Cardiac and mediastinal contours are normal.  Pulmonary vasculature is normal.  Lungs are clear.  Bony thorax is unremarkable.      Within normal limits    ______________________________________ Electronically signed by: 08/11/2021 M.D. Date:     08/09/2021 Time:    05:38       Assessment:  Recurrent palpitations since  08/07/2021 this week  History of atrial  fibrillation last episode about a year ago previously  Status post ablation January 2012  History of cardioversion previously  Obstructive sleep apnea  Obesity  History of excessive alcohol consumption on occasions  Chronic anticoagulation with Eliquis  History of cardiomyopathy  Echocardiogram 08/09/2021 EF 50% difficult to assess due to heart rate in the 150s mildly dilated right ventricle mildly dilated right atrium  Negative troponin      Recommendations:  Continue to keep n.p.o.  Reviewed case with Dr. Daniel Nones electrophysiology he will look at this afternoon patient is desiring to proceed with definitive treatment this afternoon if possible possible cardioversion.  Advised indication alternatives benefits and risk patient agreeable.

## 2021-08-09 NOTE — Progress Notes (Signed)
Okay to discharge from Dr. Mayford Knife standpoint. Electronically signed by Janelle Floor, RN on 08/09/2021 at 4:53 PM

## 2021-08-09 NOTE — ED Notes (Signed)
Called Dr. Deitra Mayo (neurology)     Clance Boll  08/09/21 0009

## 2021-08-09 NOTE — ED Notes (Signed)
Cardizem GTT titrated up to 7.5 mg/ hr. PT resting comfortably, BP 109/80 (MAP 89), HR 151, unable to document titration in [facility] d/t department wide system-error.      Charlett Nose, RN  08/09/21 0225

## 2021-08-09 NOTE — Procedures (Signed)
Date of Procedure:  08/09/2021     Indications:  Atrial fibrillation with an appropriate duration of anticoagulation     Conscious Sedation Protocol Used During this Procedure -        Anesthesia: Moderate   Sedation:   4 mg Midazolam (Versed)     100 mcg Fentanyl   Start time: 1319   Stop time: 1324   ASA Class: 2   EBL Not applicable     A trained medical personnel administered medications at my direction.  The patient was monitored continuously with ECG, pulse oximetry, blood pressure monitoring and direct observation.       After obtaining informed written consent and an appropriate level of conscious sedation, DCCV with 360 J was successful in restoring sinus rhythm.      Complications:  none      Impression:  Successful DC cardioversion of atrial fibrillation to normal sinus rhythm on eliquis used for anticoagulation.    Electronically signed by Dorothey Baseman, MD on 08/09/21

## 2021-08-09 NOTE — H&P (Signed)
[facility] History and Physical    Patient Information:  Patient: Gary Cherry  MRN: [id number]   Acct: [id number]  Date of Birth: August 10, 1975  Admit Date: 08/09/2021  Primary Care Physician: No primary care provider on file.  Advance Directive: Full Code  Health Care Proxy: Mrs. Beverly Milch, his (half) sister, [phone]         SUBJECTIVE:    Chief Complaint   Patient presents with    Atrial Fibrillation     Been running approx 150s for 3 days, has hx of this and had to be cardioverted in the past     EP Sign Out:  A Fib with RVR, provided 20mg  Cardizem as boluses and still ranges from 110-150bpm    HPI:  Gary Cherry is a pleasant young-for-age appearing 46 year old gent. He states that he had the onset of this episode on Monday night. He states that he thinks it has been intermittent. He states that his finger puls-ox had looked very regular, but that the rate varied between fingers as much as one being 80 and another being 140 at the same time. He ha a history of paroxysmal atrial fibrillation since 2012, and had a cardioversion prior to the EP study tat year as well as two more in years after. He states that he does not have a heart doctor here he had last seen Dr 2013 in [place].    Review of Systems:   Review of Systems   Constitutional:  Positive for diaphoresis. Negative for chills, fatigue and fever.   Respiratory:  Negative for shortness of breath.    Cardiovascular:  Positive for palpitations. Negative for chest pain and leg swelling.   Gastrointestinal:  Negative for nausea.   Neurological:  Negative for weakness and light-headedness.   Psychiatric/Behavioral:  Negative for confusion.      Past Medical History:   Diagnosis Date    ablation procedure for atrial fibrillation 2012    uncertaim as to the auricle, done in 2013    Alcoholism [facility])     Atrial fibrillation (HCC)     2012    Cardiomyopathy [facility])     History of cardioversion     x3: 2012, ~2017 ,2022     Sleep apnea     V-tach [facility])      Past Psychiatric History:  Denies any    Past Surgical History:   Procedure Laterality Date    ABLATION OF DYSRHYTHMIC FOCUS  2012    [facility] Electrophysiology     LAPAROSCOPIC APPENDECTOMY N/A 2006    ORTHOPEDIC SURGERY Left 2016    ACL repair      Social History       Tobacco History       Smoking Status  Never      Smokeless Tobacco Use  Never              Alcohol History       Alcohol Use Status  Not Currently Comment  approx 1-2 nights a week              Drug Use       Drug Use Status  Never              Sexual Activity       Sexually Active  Not Asked Comment  has a daughter             CODE STATUS: Full Code  HEALTH  CARE PROXY: Mrs. Beverly Milch, his (half) sister, [phone]  AMBULATES: independently  DOMICILED: has no stairs to enter his home, has no stairs inside his home, his daughter is there half the time, has no pets     Family History   Problem Relation Age of Onset    Heart Disease Mother         died at 63, SMOKER    Heart Disease Father         died at 47, SMOKER    No Known Problems Daughter      Allergies:   No Known Allergies    Home Medications:  Prior to Admission medications    Medication Sig Start Date End Date Taking? Authorizing Provider   LISINOPRIL PO Take by mouth   Yes Historical Provider, MD   aspirin 81 MG EC tablet Take 81 mg by mouth daily  Patient not taking: Reported on 08/08/2021    Historical Provider, MD   metoprolol tartrate (LOPRESSOR) 25 MG tablet Take 1 tablet by mouth 2 times daily Take 1/2 tablet in the morning and 1 full tablet at night.    Historical Provider, MD   apixaban (ELIQUIS) 5 MG TABS tablet Take 1 tablet by mouth 2 times daily    Historical Provider, MD         OBJECTIVE:    Vitals:    08/09/21 0849   BP:    Pulse: (!) 120   Resp:    Temp:    SpO2:    Breathing room air    BP 104/80   Pulse (!) 120   Temp 97.7 F (36.5 C)   Resp 16   Ht 5\' 11"  (1.803 m)   Wt 284 lb 3.2 oz (128.9 kg)   SpO2 98%   BMI 39.64  kg/m       Intake/Output Summary (Last 24 hours) at 08/09/2021 0956  Last data filed at 08/09/2021 0745  Gross per 24 hour   Intake --   Output 400 ml   Net -400 ml     Physical Exam  Vitals reviewed.   Constitutional:       General: He is not in acute distress.     Appearance: Normal appearance. He is normal weight. He is not ill-appearing or toxic-appearing.   HENT:      Head: Normocephalic and atraumatic.      Nose: No congestion or rhinorrhea.   Eyes:      General:         Right eye: No discharge.         Left eye: No discharge.   Neck:      Comments: Supple, trachea appears midline  Cardiovascular:      Rate and Rhythm: Tachycardia present. Rhythm irregular.      Heart sounds: No murmur heard.    No friction rub. No gallop.   Pulmonary:      Effort: Pulmonary effort is normal. No respiratory distress.      Breath sounds: No stridor. No wheezing, rhonchi or rales.   Chest:      Chest wall: No tenderness.   Abdominal:      Tenderness: There is no abdominal tenderness. There is no guarding or rebound.   Musculoskeletal:      Right lower leg: No edema.      Left lower leg: No edema.   Skin:     General: Skin is warm.      Comments: nondiaphoretic   Neurological:  Mental Status: He is alert and oriented to person, place, and time.   Psychiatric:         Mood and Affect: Mood normal.         Behavior: Behavior normal.        LABORATORY DATA:    CBC:   Recent Labs     08/08/21  2324   WBC 9.2   HGB 17.4   HCT 46.7   PLT 256     BMP:   Recent Labs     08/08/21  2324 08/09/21  0528   NA 137 139   K 3.9 4.4   CL 103 105   CO2 21* 22   BUN 20 18   CREATININE 1.1 1.2   CALCIUM 9.2 8.8     Hepatic Profile:   Recent Labs     08/08/21  2324   AST 18   ALT 35   BILITOT 0.4   ALKPHOS 97     Coag Panel:   Recent Labs     08/08/21  2324   INR 1.07   PROTIME 13.6   APTT 28.9     Cardiac Enzymes:   Recent Labs     08/08/21  2324 08/09/21  0803   TROPONINI <0.01 <0.01     Pro-BNP: No results for input(s): PROBNP in the last 72  hours.  A1C: No results for input(s): LABA1C in the last 72 hours.  TSH: Invalid input(s): LABTSH  Lipid Panel: Invalid input(s): LABLIP    EKG:   Tracing not available, read copied from Dr. Daphene Calamity note  "Tachycardia.  Rate 153.  Normal QT.  Borderline ST changes.  No prior EKG for comparison."    IMAGING:  XR CHEST PORTABLE  Result Date: 08/09/2021  Within normal limits    ______________________________________ Electronically signed by: Cheri Fowler M.D. Date:     08/09/2021 Time:    05:38         ASESSMENTS & PLANS:    New-Onset Atrial Fibrillation with Rapid Ventricular Response:  Admit  to Cardiac Tele Ward under Hospitalist Team  Cardiology consultation in AM  EKG  TTE (Dilated Atria?, Valvular Causes?)   Troponin trend  NPO after MN, cardiac diet prior to that withOUT caffeine  Lipid Panel in AM  TSH with FT4 reflex in AM  Telemetry  BMP, Magnesium and Phosphorous to be added  ASA EC 81 mg daily as per home  AC with Apixaban as per home  Continue Metoprolol 12.5mg  PO QAM and 25mg  PO nightly  Diltiazem GGT   aspirin  81 mg Oral Daily    apixaban  5 mg Oral BID    metoprolol tartrate  12.5 mg Oral Daily    metoprolol tartrate  25 mg Oral Nightly    lisinopril   2.5 mg Oral Daily     Supoportive and Prophylactic Txx:  DVT Prophylaxis: Apixaban as per home regimen  GI (PUD) PPx: not indicated as such  PT consult for evalutation and Txx as indicated: not indicated  Diet NPO  sodium chloride flush, sodium chloride, ondansetron **OR** ondansetron, acetaminophen **OR** acetaminophen      Care time of 40 minutes  Pt seen/examined and admitted to inpatient status.  Inpatient status is used for patients with an expected LOS extending past two midnights due to medical therapy and or critical care needs, otherwise patients are placed to OBServation status.    Signed:  Electronically signed by , MD on 08/09/21 at 10:10  AM CDT.

## 2021-08-09 NOTE — Progress Notes (Signed)
Gary Cherry arrived to room # 722.   Presented with: Atrial fibrillation with RVR  Mental Status: Patient is oriented, alert, coherent, logical, thought processes intact, and able to concentrate and follow conversation.   Vitals:    08/09/21 0315   BP: 104/79   Pulse: (!) 150   Resp: 18   Temp: 98.2 F (36.8 C)   SpO2: 98%     Patient safety contract and falls prevention contract reviewed with patient Yes.  Oriented Patient to room.  Call light within reach. Yes.  Needs, issues or concerns expressed at this time: no.      Electronically signed by [name], RN on 08/09/2021 at 3:27 AM

## 2021-08-09 NOTE — ED Notes (Signed)
Report called to Pamelia Hoit, Charity fundraiser. Cardizem GTT titrated down to 5 mg/ hr from 7.5 mg/hr. Current BP 97/76 (MAP 85), HR 151. Unable to document titration in [facility] due to hospital wide downtime.      Charlett Nose, RN  08/09/21 [phone]

## 2021-08-09 NOTE — Plan of Care (Signed)
Problem: Discharge Planning  Goal: Discharge to home or other facility with appropriate resources  08/09/2021 1433 by Janelle Floor, RN  Outcome: Progressing  08/09/2021 0407 by [name], RN  Outcome: Progressing  Flowsheets (Taken 08/09/2021 0330)  Discharge to home or other facility with appropriate resources: Identify barriers to discharge with patient and caregiver

## 2021-08-09 NOTE — Discharge Summary (Signed)
Hospital Medicine Discharge Summary    Patient ID: Gary Cherry      Patient's PCP: No primary care provider on file.    Admit Date: 08/08/2021     Discharge Date:   08/09/2021    Admitting Provider: Lollie Marrow, MD     Discharge Provider: Bernette Mayers, MD     Discharge Diagnoses:       Active Hospital Problems    Diagnosis     Atrial fibrillation with RVR (HCC) [I48.91]        The patient was seen and examined on day of discharge and this discharge summary is in conjunction with any daily progress note from day of discharge.    Hospital Course: 46yo man with Hx of paroxysmal Afib s/p several cardioversion procedures, on eliquis and BB, Hx of alcohol abuse, presents with palpitations.   Feels ongoing palpitations.   No chest pain, no dyspnea, no lightheadedness or LOC.      Does feel generally very weak when gets episodes of Afib.      Tele reveals Aflutter with variable degree block rate still 110-150 range.       Aflutter variable degree block with RVR - Hx of paroxysmal Afib s/p cardioversion x3.   Serial trop (-) - ACS ruled out.   Ongoing alcohol use unfortunately increases his risk of recurrent arrhythmia - discussed with pt at length.               - cardiology involved.               - pt reports he is compliant with his BB and eliquis prior to admission.               - evaluated by EP - s/p successful cardioversion with Dr Mayford Knife today.   - stable for discharge home.              Alcohol use.   No signs of alcohol withdrawals and pt denies any prior hx of complicated alcohol withdrawal.   Counseled extensively.         Physical Exam Performed:     BP 113/77   Pulse 74   Temp 97.4 F (36.3 C) (Temporal)   Resp 18   Ht 5\' 11"  (1.803 m)   Wt 284 lb 3.2 oz (128.9 kg)   SpO2 99%   BMI 39.64 kg/m       General appearance:  No apparent distress, appears stated age and cooperative.  HEENT:  Normal cephalic, atraumatic without obvious deformity. Pupils equal, round, and reactive to light.   Extra ocular muscles intact. Conjunctivae/corneas clear.  Neck: Supple, with full range of motion. No jugular venous distention. Trachea midline.  Respiratory:  Normal respiratory effort. Clear to auscultation, bilaterally without Rales/Wheezes/Rhonchi.  Cardiovascular:  Regular rate and rhythm with normal S1/S2 without murmurs, rubs or gallops.  Abdomen: Soft, non-tender, non-distended with normal bowel sounds.  Musculoskeletal:  No clubbing, cyanosis or edema bilaterally.  Full range of motion without deformity.  Skin: Skin color, texture, turgor normal.  No rashes or lesions.  Neurologic:  Neurovascularly intact without any focal sensory/motor deficits. Cranial nerves: II-XII intact, grossly non-focal.  Psychiatric:  Alert and oriented, thought content appropriate, normal insight  Capillary Refill: Brisk,< 3 seconds   Peripheral Pulses: +2 palpable, equal bilaterally       Labs: For convenience and continuity at follow-up the following most recent labs are provided:      CBC:  Lab Results   Component Value Date/Time    WBC 9.2 08/08/2021 11:24 PM    HGB 17.4 08/08/2021 11:24 PM    HCT 46.7 08/08/2021 11:24 PM    PLT 256 08/08/2021 11:24 PM       Renal:    Lab Results   Component Value Date/Time    NA 139 08/09/2021 05:28 AM    K 4.4 08/09/2021 05:28 AM    CL 105 08/09/2021 05:28 AM    CO2 22 08/09/2021 05:28 AM    BUN 18 08/09/2021 05:28 AM    CREATININE 1.2 08/09/2021 05:28 AM    CALCIUM 8.8 08/09/2021 05:28 AM         Significant Diagnostic Studies    Radiology:   XR CHEST PORTABLE   Final Result   Within normal limits               ______________________________________    Electronically signed by: Cheri Fowler M.D.   Date:     08/09/2021   Time:    05:38              Consults:     IP CONSULT TO CARDIOLOGY  IP CONSULT TO CARDIOLOGY  IP CONSULT TO ELECTROPHYSIOLOGY    Disposition:  home    Condition at Discharge: Stable    Discharge Instructions/Follow-up:  PCP 1 week     Code Status:  Full Code      Activity: activity as tolerated    Diet: regular diet      Discharge Medications:     Current Discharge Medication List             Details   aspirin 81 MG EC tablet Take 81 mg by mouth daily      metoprolol tartrate (LOPRESSOR) 25 MG tablet Take 1 tablet by mouth 2 times daily Take 1/2 tablet in the morning and 1 full tablet at night.      apixaban (ELIQUIS) 5 MG TABS tablet Take 1 tablet by mouth 2 times daily             Time Spent on discharge: in the examination, evaluation, counseling and review of medications and discharge plan.      Signed:    Bernette Mayers, MD   08/09/2021      Thank you No primary care provider on file. for the opportunity to be involved in this patient's care. If you have any questions or concerns, please feel free to contact me at [phone].

## 2021-08-09 NOTE — Progress Notes (Signed)
Pt arrived to [facility] holding room 3 from PCU at approx 1236. Pt placed on bedside monitor and in aflutter with HR 120's.  Cardizem gtt was not infusing upon arrival, r/t bag being dry.  New cardizem bag started, see mar for details. BP 114/78, RR 22. Consents verified. Pt to cath lab at this time with Cath lab RN. Electronically signed by Claudette Head, RN on 08/09/2021 at 1:03 PM

## 2021-08-09 NOTE — Progress Notes (Signed)
Hospitalist Progress Note        PCP: No primary care provider on file.    Date of Admission: 08/08/2021    Length of Stay: 0    Chief Complaint: palpitations.         Subjective:     Feels ongoing palpitations.   No chest pain, no dyspnea, no lightheadedness or LOC.     Does feel generally very weak when gets episodes of Afib.       Tele reveals Aflutter with variable degree block rate still 110-150 range.         Medications:  Reviewed    Infusion Medications    dilTIAZem (CARDIZEM) 125 mg/125 mL infusion 5 mg/hr (08/09/21 1241)    sodium chloride      sodium chloride 125 mL/hr at 08/09/21 0939     Scheduled Medications    aspirin  81 mg Oral Daily    apixaban  5 mg Oral BID    metoprolol tartrate  12.5 mg Oral Daily    metoprolol tartrate  25 mg Oral Nightly    sodium chloride flush  5-40 mL IntraVENous 2 times per day    lisinopril  2.5 mg Oral Daily     PRN Meds: sodium chloride flush, sodium chloride, ondansetron **OR** ondansetron, acetaminophen **OR** acetaminophen      Intake/Output Summary (Last 24 hours) at 08/09/2021 1246  Last data filed at 08/09/2021 0745  Gross per 24 hour   Intake --   Output 400 ml   Net -400 ml       Physical Exam Performed:    BP 111/70   Pulse 54   Temp 97.2 F (36.2 C)   Resp 16   Ht 5\' 11"  (1.803 m)   Wt 284 lb 3.2 oz (128.9 kg)   SpO2 96%   BMI 39.64 kg/m     General appearance: No apparent distress, appears stated age and cooperative.  HEENT: Pupils equal, round, and reactive to light. Conjunctivae/corneas clear.  Neck: Supple, with full range of motion. No jugular venous distention. Trachea midline.  Respiratory:  Normal respiratory effort. Clear to auscultation, bilaterally without Rales/Wheezes/Rhonchi.  Cardiovascular: Regular rate and rhythm with normal S1/S2 without murmurs, rubs or gallops.  Abdomen: Soft, non-tender, non-distended with normal bowel sounds.  Musculoskeletal: No clubbing, cyanosis or edema bilaterally.  Full range of motion without  deformity.  Skin: Skin color, texture, turgor normal.  No rashes or lesions.  Neurologic:  Neurovascularly intact without any focal sensory/motor deficits. Cranial nerves: II-XII intact, grossly non-focal.  Psychiatric: Alert and oriented, thought content appropriate, normal insight  Capillary Refill: Brisk, 3 seconds, normal   Peripheral Pulses: +2 palpable, equal bilaterally       Labs:   Recent Labs     08/08/21  2324   WBC 9.2   HGB 17.4   HCT 46.7   PLT 256     Recent Labs     08/08/21  2324 08/09/21  0528   NA 137 139   K 3.9 4.4   CL 103 105   CO2 21* 22   BUN 20 18   CREATININE 1.1 1.2   CALCIUM 9.2 8.8     Recent Labs     08/08/21  2324   AST 18   ALT 35   BILITOT 0.4   ALKPHOS 97     Recent Labs     08/08/21  2324   INR 1.07     Recent Labs  08/08/21  2324 08/09/21  0803   TROPONINI <0.01 <0.01       Urinalysis:    No results found for: NITRU, WBCUA, BACTERIA, RBCUA, BLOODU, SPECGRAV, GLUCOSEU    Radiology:  XR CHEST PORTABLE   Final Result   Within normal limits               ______________________________________    Electronically signed by: Cheri Fowler M.D.   Date:     08/09/2021   Time:    05:38           IP CONSULT TO CARDIOLOGY  IP CONSULT TO CARDIOLOGY  IP CONSULT TO ELECTROPHYSIOLOGY    Assessment/Plan:    Active Hospital Problems    Diagnosis     Atrial fibrillation with RVR (HCC) [I48.91]          Aflutter variable degree block with RVR - Hx of paroxysmal Afib s/p cardioversion x3.   Serial trop (-) - ACS ruled out.   Ongoing alcohol use unfortunately increases his risk of recurrent arrhythmia - discussed with pt at length.    - cardiology involved.    - pt reports he is compliant with his BB and eliquis prior to admission.    - noted plan to consult with EP for cardioversion and consideration for ablation.         Alcohol use.   No signs of alcohol withdrawals and pt denies any prior hx of complicated alcohol withdrawal.   Counseled extensively.       DVT Prophylaxis: eliquis  Diet:  Diet NPO  Code Status: Full Code      Dispo - cc        Bernette Mayers, MD

## 2021-08-09 NOTE — Consults (Signed)
Cardiology Consult Note    ADMISSION DAY:  08/08/2021 11:10 PM   Consult Date:  08/09/2021 12:55 PM    Reason for Consultation: atrial flutter    History of Present Illness: Pt has had hx AF since 2011 and developed palpitations again on Monday. He has had two cardioversions in the past. He had PVI 2012. He was admitted with atrial flutter / fib and wishes to undergo cardioversion.    No Known Allergies    Current Medications  Current Facility-Administered Medications: dilTIAZem 125 mg in sodium chloride 0.9 % 125 mL infusion, 2.5-15 mg/hr, IntraVENous, Continuous  aspirin EC tablet 81 mg, 81 mg, Oral, Daily  apixaban (ELIQUIS) tablet 5 mg, 5 mg, Oral, BID  metoprolol tartrate (LOPRESSOR) tablet 12.5 mg, 12.5 mg, Oral, Daily  metoprolol tartrate (LOPRESSOR) tablet 25 mg, 25 mg, Oral, Nightly  sodium chloride flush 0.9 % injection 5-40 mL, 5-40 mL, IntraVENous, 2 times per day  sodium chloride flush 0.9 % injection 5-40 mL, 5-40 mL, IntraVENous, PRN  0.9 % sodium chloride infusion, , IntraVENous, PRN  ondansetron (ZOFRAN-ODT) disintegrating tablet 4 mg, 4 mg, Oral, Q8H PRN **OR** ondansetron (ZOFRAN) injection 4 mg, 4 mg, IntraVENous, Q6H PRN  acetaminophen (TYLENOL) tablet 650 mg, 650 mg, Oral, Q4H PRN **OR** acetaminophen (TYLENOL) suppository 650 mg, 650 mg, Rectal, Q4H PRN  lisinopril (PRINIVIL;ZESTRIL) tablet 2.5 mg, 2.5 mg, Oral, Daily  0.9 % sodium chloride infusion, , IntraVENous, Continuous   dilTIAZem (CARDIZEM) 125 mg/125 mL infusion 5 mg/hr (08/09/21 1241)    sodium chloride      sodium chloride 125 mL/hr at 08/09/21 3329       Past Medical History   has a past medical history of ablation procedure for atrial fibrillation, Alcoholism (HCC), Atrial fibrillation (HCC), Cardiomyopathy (HCC), History of cardioversion, Sleep apnea, and V-tach (HCC).    Past Surgical History   has a past surgical history that includes laparoscopic appendectomy (N/A, 2006); orthopedic surgery (Left, 2016); and ablation of  dysrhythmic focus (2012).    Social History   reports that he has never smoked. He has never used smokeless tobacco. He reports that he does not currently use alcohol. He reports that he does not use drugs.    Family History  family history includes Heart Disease in his father and mother; No Known Problems in his daughter.    Review of Systems:   Negative except as noted in HPI, 10 organ review performed. The ten organ systems reviewed included: cardiac, respiratory, eyes, constitutional, gastrointestinal, genitourinary, musculoskeletal, neurologic, integumentary, and ear-nose-mouth-throat.    Physical Examination:    Vitals:    08/09/21 0658 08/09/21 0800 08/09/21 0849 08/09/21 1210   BP:  104/80  111/70   Pulse:  53 (!) 120 54   Resp:  16     Temp:  97.7 F (36.5 C)  97.2 F (36.2 C)   TempSrc:       SpO2: 92% 98%  96%   Weight:       Height:         General - He is a well-developed, well-nourished.   HEENT - Ears and nose are normal.   Moist mucous membranes.  Hearing is normal.  EYES -  No conjunctival hemorrhages or discharge. PERRLA.  Neck - no thyromegaly, JVP is neg, Carotids no bruit.  Respiratory - respirations are not labored, clear bilat  Cardiovascular - S1S2 tachy heart sounds.  no murmur.  Abdominal -  Soft.  Bowel sounds present.  Nontender. No  hernia.    Extremities - no edema. Musculoskeletal - No cyanosis or clubbing.  Skin -  No statis ulcers or dermatitis.  Warm, normal turgor.  Neuro/Psych -  He is alert and oriented x3 and answered questions appropriately with normal affect and mood.    Data:  No intake/output data recorded.   Date 08/09/21 0000 - 08/09/21 2359   Shift 0000-0759 6440-3474 1600-2359 24 Hour Total   INTAKE   Shift Total(mL/kg)       OUTPUT   Urine(mL/kg/hr) 400(0.4)   400   Shift Total(mL/kg) 400(3.1)   400(3.1)   Weight (kg) 128.9 128.9 128.9 128.9     Wt Readings from Last 3 Encounters:   08/09/21 284 lb 3.2 oz (128.9 kg)      Body mass index is 39.64 kg/m.      Recent Labs      08/08/21  2324   WBC 9.2   RBC 5.52   HGB 17.4   HCT 46.7   MCV 84.6   MCH 31.5*   MCHC 37.3*   RDW 12.6   PLT 256   MPV 10.2         Recent Labs     08/08/21  2324 08/09/21  0528   NA 137 139   K 3.9 4.4   CL 103 105   CO2 21* 22   BUN 20 18   CREATININE 1.1 1.2   GLUCOSE 134* 121*   CALCIUM 9.2 8.8   PROT 6.4*  --    LABALBU 4.4  --    BILITOT 0.4  --    ALKPHOS 97  --    AST 18  --    ALT 35  --       Other Electrolytes:  FLP:  No results found for: TRIG, HDL, LDLCALC, LDLDIRECT, LABVLDL  TSH:  No results found for: TSH  Troponin T:   Recent Labs     08/08/21  2324 08/09/21  0803   TROPONINI <0.01 <0.01     INR:   Recent Labs     08/08/21  2324   INR 1.07       Telemetry:  (I reviewed) atrial flutter at present  EKG: (I reviewed) typical flutter    Assessment/Plan:    1) AF / Afl - he is anticoagulated chronically and reports compliance with apixaban. Will plan for cardioversion and then he will follow up with me as an outpt to discuss potential flutter ablation.     Dorothey Baseman, MD, 08/09/2021 12:55 PM

## 2021-08-09 NOTE — Progress Notes (Signed)
Social Determinants of Health     Tobacco Use: Low Risk     Smoking Tobacco Use: Never    Smokeless Tobacco Use: Never    Passive Exposure: Not on file   Alcohol Use: Not on file   Financial Resource Strain: Not on file   Food Insecurity: Not on file   Transportation Needs: Not on file   Physical Activity: Not on file   Stress: Not on file   Social Connections: Not on file   Intimate Partner Violence: Not on file   Depression: Not on file   Housing Stability: Not on file     Depression screening from [facility] wheel:         PHQ-9 Score (if applicable):         SDOH: Patient Able to answer Social Determinant of Health screening questions.     History    Tobacco Use      Smoking status: Never      Smokeless tobacco: Never    E-cigarette/Vaping       Questions Responses    E-cigarette/Vaping Use Never Assessed    Start Date     Passive Exposure     Quit Date     Counseling Given     Comments             Social History     Substance and Sexual Activity   Drug Use Never      HX: Patient Able to answer Tobacco History and/or Substance use screening questions.     Psychosocial: Abuse Screening  Physical Abuse: Denies  Verbal Abuse: Denies  Emotional abuse: Denies  Financial Abuse: Denies  Sexual abuse: Denies    Functional Screening: ADL Screening  Because of a physical, mental, or emotional condition, do you have serious difficulty concentrating, remembering, or making decisions?    Because of a physical, mental, or emotional condition, do you have difficulty doing errands alone such as visiting a doctor's office or shopping?      Utilities  In the past 12 months, has the electric, gas, oil or water company threatened to shut off services in your home? No    Education  Do you want help with school or training? For example, starting or completing job training or getting a high school diploma, GED, or equivalent? No  Do you speak a language other than English at home? No    Employment  Do you want help finding or keeping work  or a job?I do not need or want help    Safety  How often does anyone including family and friends, physically hurt you? Never  How often does anyone including family and friends, insult or talk down to you? Never  How often does anyone including family and friends, threaten you with harm? Never  How often does anyone including family and friends, scream or curse at you? Never    Basic Daily Needs  Think about the place you live. Do you have problems with any of the following?None of the above    Family & Community Support  How often do you feel lonely or isolated from those around you? Never    Care Management consult needed? No

## 2021-08-09 NOTE — ED Notes (Signed)
PCU RN updated on pt's BP improvement, pt to be transported shortly.      Charlett Nose, RN  08/09/21 [phone]

## 2021-08-09 NOTE — Progress Notes (Shared)
4 Eyes Skin Assessment    Gary Cherry is being assessed upon: Admission    I agree that I, [name], RN, along with *** (either 2 RN's or 1 LPN and 1 RN) have performed a thorough Head to Toe Skin Assessment on the patient. ALL assessment sites listed below have been assessed.      Areas assessed by both nurses:     [x]    Head, Face, and Ears   [x]    Shoulders, Back, and Chest  [x]    Arms, Elbows, and Hands   [x]    Coccyx, Sacrum, and Ischium  [x]    Legs, Feet, and Heels    Does the Patient have Skin Breakdown? No    Braden Prevention initiated: NA  Wound Care Orders initiated: NA    WOC nurse consulted for Pressure Injury (Stage 3,4, Unstageable, DTI, NWPT, and Complex wounds) and New or Established Ostomies: NA        Primary Nurse eSignature: , RN on 08/09/2021 at 3:27 AM      Co-Signer eSignature: Electronically signed by , RN on 08/09/21 at 3:27 AM CDT

## 2021-08-10 ENCOUNTER — Other Ambulatory Visit (HOSPITAL_BASED_OUTPATIENT_CLINIC_OR_DEPARTMENT_OTHER): Payer: Self-pay

## 2021-08-10 LAB — EKG 12-LEAD
Q-T Interval: 332 ms
QRS Duration: 104 ms
QTc Calculation (Bazett): 468 ms
T Axis: 64 degrees

## 2021-08-15 ENCOUNTER — Other Ambulatory Visit (HOSPITAL_BASED_OUTPATIENT_CLINIC_OR_DEPARTMENT_OTHER): Payer: Self-pay

## 2021-08-27 ENCOUNTER — Encounter: Payer: PRIVATE HEALTH INSURANCE | Attending: Nurse Practitioner

## 2021-09-07 ENCOUNTER — Other Ambulatory Visit (HOSPITAL_BASED_OUTPATIENT_CLINIC_OR_DEPARTMENT_OTHER): Payer: Self-pay

## 2021-09-07 ENCOUNTER — Encounter: Payer: PRIVATE HEALTH INSURANCE | Attending: Nurse Practitioner

## 2021-09-14 ENCOUNTER — Other Ambulatory Visit (HOSPITAL_BASED_OUTPATIENT_CLINIC_OR_DEPARTMENT_OTHER): Payer: Self-pay

## 2021-10-04 DIAGNOSIS — K51919 Ulcerative colitis, unspecified with unspecified complications: Secondary | ICD-10-CM | POA: Diagnosis not present

## 2021-10-04 DIAGNOSIS — Z888 Allergy status to other drugs, medicaments and biological substances status: Secondary | ICD-10-CM | POA: Diagnosis not present

## 2021-10-04 DIAGNOSIS — Z882 Allergy status to sulfonamides status: Secondary | ICD-10-CM | POA: Diagnosis not present

## 2021-10-09 ENCOUNTER — Other Ambulatory Visit (HOSPITAL_BASED_OUTPATIENT_CLINIC_OR_DEPARTMENT_OTHER): Payer: Self-pay

## 2021-10-10 DIAGNOSIS — K51919 Ulcerative colitis, unspecified with unspecified complications: Secondary | ICD-10-CM | POA: Diagnosis not present

## 2021-10-19 ENCOUNTER — Telehealth: Payer: Self-pay | Admitting: Gastroenterology

## 2021-10-19 NOTE — Telephone Encounter (Signed)
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  It was mentioned that he start Entyvio but he asked that Dr Ardis Hughs review and make recommendations. He is currently taking Lialda 4.8 g daily.  He prefers to have Entyvio in [place] if he should proceed.  There are records in [place].  He says he feels fine but his inflammatory markers are elevated.  Can you please review.  Thank you

## 2021-10-19 NOTE — Telephone Encounter (Signed)
[name], please check if he is willing to come in for a office visit with one of the providers to discuss options for treatment as we have few other options other than Entyvio that can be more effective. Ok to schedule with any available provider soon within 1-2 weeks

## 2021-10-19 NOTE — Telephone Encounter (Signed)
Inbound call from patient requesting a call back to discuss his treatment plan. Please advise.

## 2021-10-22 NOTE — Telephone Encounter (Signed)
11/14 at 3 pm with Dr Bryan Lemma made per pt preference.

## 2021-10-26 ENCOUNTER — Other Ambulatory Visit (HOSPITAL_BASED_OUTPATIENT_CLINIC_OR_DEPARTMENT_OTHER): Payer: Self-pay

## 2021-10-26 MED ORDER — DEXCOM G6 SENSOR MISC
11 refills | Status: DC
  Filled 2021-10-26 – 2021-11-07 (×2): qty 3, 30d supply, fill #0

## 2021-11-05 ENCOUNTER — Other Ambulatory Visit (HOSPITAL_BASED_OUTPATIENT_CLINIC_OR_DEPARTMENT_OTHER): Payer: Self-pay

## 2021-11-07 ENCOUNTER — Other Ambulatory Visit (HOSPITAL_BASED_OUTPATIENT_CLINIC_OR_DEPARTMENT_OTHER): Payer: Self-pay

## 2021-11-23 ENCOUNTER — Other Ambulatory Visit (HOSPITAL_BASED_OUTPATIENT_CLINIC_OR_DEPARTMENT_OTHER): Payer: Self-pay

## 2021-11-23 MED ORDER — BD PEN NEEDLE NANO U/F 32G X 4 MM MISC
11 refills | Status: AC
  Filled 2021-11-23: qty 100, 30d supply, fill #0
  Filled 2022-01-10: qty 100, 30d supply, fill #1
  Filled 2022-03-21: qty 100, 30d supply, fill #2
  Filled 2022-10-16: qty 100, 30d supply, fill #3

## 2021-11-27 ENCOUNTER — Encounter: Payer: Self-pay | Admitting: Gastroenterology

## 2021-11-27 ENCOUNTER — Ambulatory Visit: Payer: 59 | Admitting: Gastroenterology

## 2021-11-27 ENCOUNTER — Other Ambulatory Visit: Payer: 59

## 2021-11-27 VITALS — BP 140/80 | HR 78 | Ht 73.0 in | Wt 176.0 lb

## 2021-11-27 DIAGNOSIS — K51919 Ulcerative colitis, unspecified with unspecified complications: Secondary | ICD-10-CM

## 2021-11-27 NOTE — Patient Instructions (Addendum)
Your provider has requested that you go to the basement level for lab work in Dec 10, 23. Press "B" on the elevator. The lab is located at the first door on the left as you exit the elevator.   If you are age 44 or older, your body mass index should be between 23-30. Your Body mass index is 23.22 kg/m. If this is out of the aforementioned range listed, please consider follow up with your Primary Care Provider.  If you are age 49 or younger, your body mass index should be between 19-25. Your Body mass index is 23.22 kg/m. If this is out of the aformentioned range listed, please consider follow up with your Primary Care Provider.   __________________________________________________________  The [facility] providers would like to encourage you to use [facility] to communicate with providers for non-urgent requests or questions.  Due to long hold times on the telephone, sending your provider a message by [facility] may be a faster and more efficient way to get a response.  Please allow 48 business hours for a response.  Please remember that this is for non-urgent requests.    Due to recent changes in healthcare laws, you may see the results of your imaging and laboratory studies on MyChart before your provider has had a chance to review them.  We understand that in some cases there may be results that are confusing or concerning to you. Not all laboratory results come back in the same time frame and the provider may be waiting for multiple results in order to interpret others.  Please give [place] 48 hours in order for your provider to thoroughly review all the results before contacting the office for clarification of your results.     Thank you for choosing me and [facility].  Vito Cirigliano, D.O.

## 2021-11-27 NOTE — Progress Notes (Signed)
Chief Complaint:    Ulcerative Colitis  GI History:  Review of pertinent gastrointestinal problems:   1. Left sided UC: Colonoscopy Dr. Penelope Coop 2006 up to 30cm. Mesalamine products did not seem to help very well. Significant flare summer 2009 steroids started. Had extreme hyperglycemia on steroids requiring insulin. Repeat colonoscopy December, 2009 showed inflammation moderate To 70 cm. He was started on sulfasalazine. Had drug rash, likely pneumonitis, LFT abnormalities ( transaminases up to almost 1000, bili up to 6). Holding his medicines resulted in complete resolution of abnormal labs checked serially. TPMT testing January, 2010 showed normal enzyme activity. February, 2010 tapered off all medicines and feels very well. May, 2010: flaring of symptoms for the past month, He went to see a [place] herbalist, did not start the oral mesalamine that I recommended, restarted prednisone. September, 2010 self-medicating with prednisone however tapering off. January, 2011, : doing well off traditional IBD medicines, taking Chinese Herb. 05/2011 severe flare, (abd pain, weight loss, bloody diarrhea Hb 6s), required admission, iv steroids, blood transfusion. October 2014: flare of symptoms (had stopped mesalamine 4 months prior); 01/2013 flare improved with prednisone and restarting lialda. Flared 06/2014, prednisone 82m BID. OV 10/2016 doing well on full dose lialda.  Colonoscopy November 2019 for chronic colitis showed normal terminal ileum, colonic mucosa was abnormal throughout.  To have mild active inflammation in the rectosigmoid.  And burned-out inflammation throughout remaining segments.  Multiple pseudopolyps throughout.  Colon was biopsied extensively.  Biopsies showed no sign of dysplasia and no active or chronic inflammation.  I colonoscopy December 2022 normal terminal ileum, mild inflammation and pseudopolyps throughout the colon as well as 20 cm segment of circumferentially severe colitis around the  splenic flexure multiple biopsies taken: Right and left colon biopsies showed chronic damage of the colon, no active inflammation, but the 20 cm segment of severe colitis showed moderately active chronic inflammation. Decided to initiate biologic therapy December 2022 January 2023 quant gold TB test negative, hepatitis C antibody nonreactive, hepatitis B testing suggested immunity Ordered infliximab 5 mg/kg usual loading protocol, his insurance company preferred Avsola rather than Remicade 02/2021 fecal calprotectin 205, CRP elevated, sed rate normal.  Initiated azathioprine 200 mg once daily and he had immediate flulike reaction and this was discontinued with resolution of symptoms 04/2021.  Had follow-up with Dr. J[facility]dis [facility]and [facility]  Calprotectin 329.  CRP normal, normal iron panel (ferritin 12) with mild anemia (H/H 12.5/37.5).  Due to multiple flares over the years with mesalamine monotherapy requiring steroids, recommended escalation of therapy.  Recommended Entyvio.  Was continued on Lialda 4.8 g/day with recommendation to use Uceris if flare while patient considered escalation of therapy.  Recommended colonoscopy every-2 years.   2. swollen, somewhat thrombosed external hemorrhoid July 2013    HPI:     Patient is a 46y.o. male presenting to the Gastroenterology Clinic for follow-up.  Since his last appoint with Dr. JArdis Hughsin 04/2021, [facility]  Have recommended Entyvio, and he presents today to discuss this in detail.  He is otherwise feeling well.  Having 2-3 formed stools daily and otherwise asymptomatic.  No hematochezia, abdominal pain.  Good p.o. intake.  Last flare was ~05/2020 (mild flare). Was asymptomatic during colo in 2022 despite active inflammation noted above and elevated fecal calprotectin 2 months  later.  Review of systems:     No chest pain, no SOB, no fevers, no urinary sx   Past Medical History:  Diagnosis Date   Blood transfusion without reported diagnosis    Colitis    Crohn's disease ([place])    Diabetes mellitus without complication ([place])    Steroid-induced hyperglycemia     Patient's surgical history, family medical history, social history, medications and allergies were all reviewed in Epic    Current Outpatient Medications  Medication Sig Dispense Refill   Cholecalciferol 125 MCG (5000 UT) TABS Take 1 tablet by mouth daily.     Continuous Blood Gluc Sensor (DEXCOM G6 SENSOR) MISC Use as directed and change sensor every 10 days 3 each 11   Continuous Blood Gluc Transmit (DEXCOM G6 TRANSMITTER) MISC Use as directed. 1 each 3   glucose blood test strip Use as directed to check blood sugars 4 times daily 400 each 2   insulin degludec (TRESIBA FLEXTOUCH) 100 UNIT/ML FlexTouch Pen Inject 23 units (titrate as directed, max daily dose of 50 units) under the skin once a day 45 mL 3   insulin lispro (HUMALOG KWIKPEN) 100 UNIT/ML KwikPen Inject 5 units for breakfast and 5 units for lunches and 8 to 10 units for evening meal subcutaneous four times a day 90 days 15 mL 11   insulin lispro (HUMALOG KWIKPEN) 100 UNIT/ML KwikPen Inject 5 units under the skin daily for breakfast, then 5 units for lunch, and 10 units for mid day meal, and 10 units for evening. **Max daily dose of 30 units** 30 mL 3   Insulin Pen Needle (BD PEN NEEDLE NANO U/F) 32G X 4 MM MISC Use as directed to administer insulin 3 times daily 100 each 11   Insulin Syringe-Needle U-100 30G X 5/16" 0.3 ML MISC USE 4 TIMES A DAY WITH NOVOLOG AND LANTUS     Isopropyl Alcohol (ALCOHOL WIPES) 70 % MISC See admin instructions.     mesalamine (LIALDA) 1.2 g EC tablet Take 4 tablets (4.8 g total) by mouth daily. 120 tablet 9   OVER THE COUNTER MEDICATION CBD Oil.  As needed daily.     TURMERIC PO Take 3 g by mouth daily.      No current facility-administered medications for this visit.    Physical Exam:     BP (!) 140/80   Pulse 78   Ht 6' 1"  (1.854 m)   Wt 176 lb (79.8 kg)   SpO2 98%   BMI 23.22 kg/m   GENERAL:  Pleasant male in NAD PSYCH: : Cooperative, normal affect NEURO: Alert and oriented x 3, no focal neurologic deficits   IMPRESSION and PLAN:    1) Ulcerative Colitis 46 year old male with longstanding history of pan UC, currently in clinical remission but does have elevated fecal calprotectin.  Interestingly, was feeling well at time of his colonoscopy in 12/2020 but with active inflammation and mildly elevated calprotectin in 02/2021.  He has been intolerant to multiple medications in the past, to include sulfasalazine and Imuran, and has had multiple courses of steroids over the years for various flares.  While he is open to the idea of starting a new medication such as Entyvio, he is somewhat reluctant due to his history of medication ADRs and otherwise being in clinical remission currently.  We discussed the risk/benefit profile of Entyvio at length today, along with some of the newer agents such as Rinvoq, Zeposia, etc. and arrived to the following plan:  - Repeat fecal calprotectin  next month (3 months after previous).  If calprotectin uptrending, plan to initiate Entyvio.  If calprotectin downtrending, will stay with Lialda monotherapy for now and periodically check calprotectin as surrogate marker of inflammation - Tentative plan for repeat colonoscopy in 12/2022 for ongoing surveillance - Of course if flare of symptoms, to call me and will initiate either Uceris with plan to initiate biologic therapy.  Trying to avoid recurrent courses of prednisone with underlying diabetes  I spent 35 minutes of time, including in depth chart review, independent review of results as outlined above, communicating results with the patient directly, face-to-face time with the patient, coordinating care, and  ordering studies and medications as appropriate, and documentation.       Lavena Bullion ,DO, FACG 11/27/2021, 3:42 PM

## 2021-12-13 ENCOUNTER — Other Ambulatory Visit (HOSPITAL_BASED_OUTPATIENT_CLINIC_OR_DEPARTMENT_OTHER): Payer: Self-pay

## 2021-12-13 DIAGNOSIS — Z794 Long term (current) use of insulin: Secondary | ICD-10-CM | POA: Diagnosis not present

## 2021-12-13 DIAGNOSIS — E099 Drug or chemical induced diabetes mellitus without complications: Secondary | ICD-10-CM | POA: Diagnosis not present

## 2021-12-13 MED ORDER — DEXCOM G7 RECEIVER DEVI
0 refills | Status: AC
  Filled 2021-12-13: qty 1, 30d supply, fill #0

## 2021-12-13 MED ORDER — DEXCOM G7 SENSOR MISC
4 refills | Status: DC
  Filled 2021-12-13: qty 9, 90d supply, fill #0
  Filled 2022-01-10: qty 9, 90d supply, fill #1
  Filled 2022-03-21: qty 3, 30d supply, fill #1
  Filled 2022-03-21: qty 6, 60d supply, fill #1
  Filled 2022-07-04: qty 9, 90d supply, fill #2
  Filled 2022-10-16: qty 9, 90d supply, fill #3

## 2021-12-14 ENCOUNTER — Other Ambulatory Visit (HOSPITAL_BASED_OUTPATIENT_CLINIC_OR_DEPARTMENT_OTHER): Payer: Self-pay

## 2021-12-18 DIAGNOSIS — D3131 Benign neoplasm of right choroid: Secondary | ICD-10-CM | POA: Diagnosis not present

## 2021-12-18 DIAGNOSIS — H11153 Pinguecula, bilateral: Secondary | ICD-10-CM | POA: Diagnosis not present

## 2021-12-18 DIAGNOSIS — E1165 Type 2 diabetes mellitus with hyperglycemia: Secondary | ICD-10-CM | POA: Diagnosis not present

## 2022-01-02 ENCOUNTER — Encounter: Payer: Self-pay | Admitting: Gastroenterology

## 2022-01-09 ENCOUNTER — Other Ambulatory Visit: Payer: 59

## 2022-01-09 DIAGNOSIS — K51919 Ulcerative colitis, unspecified with unspecified complications: Secondary | ICD-10-CM | POA: Diagnosis not present

## 2022-01-10 ENCOUNTER — Other Ambulatory Visit: Payer: Self-pay

## 2022-01-10 ENCOUNTER — Other Ambulatory Visit (HOSPITAL_BASED_OUTPATIENT_CLINIC_OR_DEPARTMENT_OTHER): Payer: Self-pay

## 2022-01-17 ENCOUNTER — Other Ambulatory Visit: Payer: Self-pay

## 2022-01-17 DIAGNOSIS — K51919 Ulcerative colitis, unspecified with unspecified complications: Secondary | ICD-10-CM

## 2022-01-17 LAB — CALPROTECTIN, FECAL: Calprotectin, Fecal: 176 ug/g — ABNORMAL HIGH (ref 0–120)

## 2022-01-23 ENCOUNTER — Other Ambulatory Visit (HOSPITAL_BASED_OUTPATIENT_CLINIC_OR_DEPARTMENT_OTHER): Payer: Self-pay

## 2022-01-24 ENCOUNTER — Other Ambulatory Visit (HOSPITAL_BASED_OUTPATIENT_CLINIC_OR_DEPARTMENT_OTHER): Payer: Self-pay

## 2022-01-24 ENCOUNTER — Telehealth: Payer: Self-pay | Admitting: Gastroenterology

## 2022-01-24 NOTE — Telephone Encounter (Signed)
Spoke with Pam at the pharmacy. Stated patient was paying 5$ for Mesalamine previously for 1 month. Now patient would have to pay 200$ out of his pocket. Stated When she ran through generic, it came down to 5$. Patient made aware.

## 2022-01-24 NOTE — Telephone Encounter (Signed)
Patient called, stated Pam the pharmacist said, that the Prior Authorization for the [facility], had been denied so the patient would have to pay out of pocket. Patient is requesting to speak to a nurse, stated he has refilled the medication before and there has been no problem. Please advise.

## 2022-02-20 ENCOUNTER — Other Ambulatory Visit: Payer: Self-pay

## 2022-03-18 DIAGNOSIS — Z794 Long term (current) use of insulin: Secondary | ICD-10-CM | POA: Diagnosis not present

## 2022-03-18 DIAGNOSIS — E099 Drug or chemical induced diabetes mellitus without complications: Secondary | ICD-10-CM | POA: Diagnosis not present

## 2022-03-18 DIAGNOSIS — R809 Proteinuria, unspecified: Secondary | ICD-10-CM | POA: Diagnosis not present

## 2022-03-21 ENCOUNTER — Other Ambulatory Visit (HOSPITAL_BASED_OUTPATIENT_CLINIC_OR_DEPARTMENT_OTHER): Payer: Self-pay

## 2022-03-21 ENCOUNTER — Other Ambulatory Visit: Payer: Self-pay

## 2022-05-09 ENCOUNTER — Other Ambulatory Visit (HOSPITAL_BASED_OUTPATIENT_CLINIC_OR_DEPARTMENT_OTHER): Payer: Self-pay

## 2022-07-04 ENCOUNTER — Other Ambulatory Visit (HOSPITAL_BASED_OUTPATIENT_CLINIC_OR_DEPARTMENT_OTHER): Payer: Self-pay

## 2022-07-05 ENCOUNTER — Other Ambulatory Visit (HOSPITAL_BASED_OUTPATIENT_CLINIC_OR_DEPARTMENT_OTHER): Payer: Self-pay

## 2022-07-16 ENCOUNTER — Other Ambulatory Visit (HOSPITAL_BASED_OUTPATIENT_CLINIC_OR_DEPARTMENT_OTHER): Payer: Self-pay

## 2022-07-16 ENCOUNTER — Other Ambulatory Visit: Payer: Self-pay | Admitting: Gastroenterology

## 2022-07-17 ENCOUNTER — Other Ambulatory Visit: Payer: Self-pay

## 2022-07-17 ENCOUNTER — Other Ambulatory Visit (HOSPITAL_BASED_OUTPATIENT_CLINIC_OR_DEPARTMENT_OTHER): Payer: Self-pay

## 2022-07-17 MED ORDER — MESALAMINE 1.2 G PO TBEC
4.8000 g | DELAYED_RELEASE_TABLET | Freq: Every day | ORAL | 5 refills | Status: DC
  Filled 2022-07-17 (×2): qty 120, 30d supply, fill #0
  Filled 2022-08-23: qty 120, 30d supply, fill #1
  Filled 2022-09-27: qty 120, 30d supply, fill #2
  Filled 2022-11-01: qty 120, 30d supply, fill #3
  Filled 2022-12-09: qty 120, 30d supply, fill #4
  Filled 2023-01-14 (×2): qty 120, 30d supply, fill #5

## 2022-07-23 DIAGNOSIS — R030 Elevated blood-pressure reading, without diagnosis of hypertension: Secondary | ICD-10-CM | POA: Diagnosis not present

## 2022-07-23 DIAGNOSIS — Z13220 Encounter for screening for lipoid disorders: Secondary | ICD-10-CM | POA: Diagnosis not present

## 2022-07-23 DIAGNOSIS — Z125 Encounter for screening for malignant neoplasm of prostate: Secondary | ICD-10-CM | POA: Diagnosis not present

## 2022-07-23 DIAGNOSIS — R799 Abnormal finding of blood chemistry, unspecified: Secondary | ICD-10-CM | POA: Diagnosis not present

## 2022-07-23 DIAGNOSIS — Z0000 Encounter for general adult medical examination without abnormal findings: Secondary | ICD-10-CM | POA: Diagnosis not present

## 2022-08-13 ENCOUNTER — Encounter: Payer: Self-pay | Admitting: Gastroenterology

## 2022-09-27 ENCOUNTER — Other Ambulatory Visit (HOSPITAL_BASED_OUTPATIENT_CLINIC_OR_DEPARTMENT_OTHER): Payer: Self-pay

## 2022-10-16 ENCOUNTER — Other Ambulatory Visit (HOSPITAL_BASED_OUTPATIENT_CLINIC_OR_DEPARTMENT_OTHER): Payer: Self-pay

## 2022-10-16 ENCOUNTER — Other Ambulatory Visit: Payer: Self-pay

## 2022-11-01 ENCOUNTER — Other Ambulatory Visit: Payer: Self-pay

## 2022-11-01 ENCOUNTER — Other Ambulatory Visit (HOSPITAL_BASED_OUTPATIENT_CLINIC_OR_DEPARTMENT_OTHER): Payer: Self-pay

## 2022-11-01 MED ORDER — INSULIN LISPRO (1 UNIT DIAL) 100 UNIT/ML (KWIKPEN)
PEN_INJECTOR | SUBCUTANEOUS | 3 refills | Status: DC
  Filled 2022-11-01: qty 27, 90d supply, fill #0
  Filled 2023-01-14 (×2): qty 27, 90d supply, fill #1
  Filled 2023-04-25 – 2023-05-07 (×2): qty 27, 90d supply, fill #2

## 2022-11-19 DIAGNOSIS — L821 Other seborrheic keratosis: Secondary | ICD-10-CM | POA: Diagnosis not present

## 2022-12-09 ENCOUNTER — Other Ambulatory Visit: Payer: Self-pay

## 2022-12-09 ENCOUNTER — Other Ambulatory Visit (HOSPITAL_BASED_OUTPATIENT_CLINIC_OR_DEPARTMENT_OTHER): Payer: Self-pay

## 2022-12-09 MED ORDER — TRESIBA FLEXTOUCH 100 UNIT/ML ~~LOC~~ SOPN
50.0000 [IU] | PEN_INJECTOR | Freq: Every day | SUBCUTANEOUS | 0 refills | Status: AC
  Filled 2022-12-09: qty 45, 90d supply, fill #0

## 2022-12-13 ENCOUNTER — Other Ambulatory Visit (HOSPITAL_BASED_OUTPATIENT_CLINIC_OR_DEPARTMENT_OTHER): Payer: Self-pay

## 2022-12-17 ENCOUNTER — Other Ambulatory Visit: Payer: Commercial Managed Care - PPO

## 2022-12-17 DIAGNOSIS — E099 Drug or chemical induced diabetes mellitus without complications: Secondary | ICD-10-CM | POA: Diagnosis not present

## 2022-12-17 DIAGNOSIS — Z794 Long term (current) use of insulin: Secondary | ICD-10-CM | POA: Diagnosis not present

## 2022-12-17 DIAGNOSIS — R809 Proteinuria, unspecified: Secondary | ICD-10-CM | POA: Diagnosis not present

## 2022-12-25 ENCOUNTER — Other Ambulatory Visit (HOSPITAL_BASED_OUTPATIENT_CLINIC_OR_DEPARTMENT_OTHER): Payer: Self-pay

## 2023-01-14 ENCOUNTER — Other Ambulatory Visit (HOSPITAL_BASED_OUTPATIENT_CLINIC_OR_DEPARTMENT_OTHER): Payer: Self-pay

## 2023-01-31 ENCOUNTER — Other Ambulatory Visit (HOSPITAL_BASED_OUTPATIENT_CLINIC_OR_DEPARTMENT_OTHER): Payer: Self-pay

## 2023-01-31 MED ORDER — DEXCOM G7 SENSOR MISC
1.0000 | 4 refills | Status: AC
  Filled 2023-01-31: qty 9, 90d supply, fill #0
  Filled 2023-05-06: qty 9, 90d supply, fill #1

## 2023-02-11 ENCOUNTER — Other Ambulatory Visit (HOSPITAL_BASED_OUTPATIENT_CLINIC_OR_DEPARTMENT_OTHER): Payer: Self-pay

## 2023-02-11 ENCOUNTER — Other Ambulatory Visit: Payer: Self-pay | Admitting: Gastroenterology

## 2023-02-11 MED ORDER — MESALAMINE 1.2 G PO TBEC
4.8000 g | DELAYED_RELEASE_TABLET | Freq: Every day | ORAL | 0 refills | Status: DC
  Filled 2023-02-11: qty 120, 30d supply, fill #0

## 2023-03-20 ENCOUNTER — Telehealth: Payer: Self-pay

## 2023-03-20 ENCOUNTER — Other Ambulatory Visit (HOSPITAL_BASED_OUTPATIENT_CLINIC_OR_DEPARTMENT_OTHER): Payer: Self-pay

## 2023-03-20 ENCOUNTER — Other Ambulatory Visit: Payer: Self-pay | Admitting: Gastroenterology

## 2023-03-20 MED ORDER — MESALAMINE 1.2 G PO TBEC
4.8000 g | DELAYED_RELEASE_TABLET | Freq: Every day | ORAL | 0 refills | Status: DC
  Filled 2023-03-20: qty 120, 30d supply, fill #0

## 2023-03-20 NOTE — Telephone Encounter (Signed)
 Contacted patient and informed him that he would need to make an appointment for further refills. Patient made an appointment for 03/26/23

## 2023-03-26 ENCOUNTER — Ambulatory Visit: Admitting: Gastroenterology

## 2023-04-09 DIAGNOSIS — H52223 Regular astigmatism, bilateral: Secondary | ICD-10-CM | POA: Diagnosis not present

## 2023-04-09 DIAGNOSIS — H5213 Myopia, bilateral: Secondary | ICD-10-CM | POA: Diagnosis not present

## 2023-04-09 DIAGNOSIS — H524 Presbyopia: Secondary | ICD-10-CM | POA: Diagnosis not present

## 2023-04-09 DIAGNOSIS — D3131 Benign neoplasm of right choroid: Secondary | ICD-10-CM | POA: Diagnosis not present

## 2023-04-09 DIAGNOSIS — E1165 Type 2 diabetes mellitus with hyperglycemia: Secondary | ICD-10-CM | POA: Diagnosis not present

## 2023-04-09 DIAGNOSIS — H11153 Pinguecula, bilateral: Secondary | ICD-10-CM | POA: Diagnosis not present

## 2023-04-14 ENCOUNTER — Other Ambulatory Visit (HOSPITAL_BASED_OUTPATIENT_CLINIC_OR_DEPARTMENT_OTHER): Payer: Self-pay

## 2023-04-14 ENCOUNTER — Other Ambulatory Visit: Payer: Self-pay | Admitting: Gastroenterology

## 2023-04-14 MED ORDER — MESALAMINE 1.2 G PO TBEC
4.8000 g | DELAYED_RELEASE_TABLET | Freq: Every day | ORAL | 0 refills | Status: DC
  Filled 2023-04-14: qty 120, 30d supply, fill #0

## 2023-04-24 ENCOUNTER — Other Ambulatory Visit: Payer: Self-pay

## 2023-04-24 ENCOUNTER — Other Ambulatory Visit

## 2023-04-24 DIAGNOSIS — K51919 Ulcerative colitis, unspecified with unspecified complications: Secondary | ICD-10-CM

## 2023-05-01 LAB — CALPROTECTIN: Calprotectin: 37 ug/g

## 2023-05-06 ENCOUNTER — Other Ambulatory Visit (HOSPITAL_BASED_OUTPATIENT_CLINIC_OR_DEPARTMENT_OTHER): Payer: Self-pay

## 2023-05-07 ENCOUNTER — Other Ambulatory Visit (HOSPITAL_BASED_OUTPATIENT_CLINIC_OR_DEPARTMENT_OTHER): Payer: Self-pay

## 2023-05-09 ENCOUNTER — Encounter: Payer: Self-pay | Admitting: Gastroenterology

## 2023-05-22 ENCOUNTER — Other Ambulatory Visit: Payer: Self-pay | Admitting: Gastroenterology

## 2023-05-22 ENCOUNTER — Other Ambulatory Visit (HOSPITAL_BASED_OUTPATIENT_CLINIC_OR_DEPARTMENT_OTHER): Payer: Self-pay

## 2023-05-22 MED ORDER — MESALAMINE 1.2 G PO TBEC
4.8000 g | DELAYED_RELEASE_TABLET | Freq: Every day | ORAL | 1 refills | Status: DC
  Filled 2023-05-22: qty 120, 30d supply, fill #0

## 2023-06-03 ENCOUNTER — Telehealth: Admitting: Physician Assistant

## 2023-06-03 DIAGNOSIS — B9689 Other specified bacterial agents as the cause of diseases classified elsewhere: Secondary | ICD-10-CM

## 2023-06-03 DIAGNOSIS — J019 Acute sinusitis, unspecified: Secondary | ICD-10-CM

## 2023-06-03 MED ORDER — AMOXICILLIN-POT CLAVULANATE 875-125 MG PO TABS: 1.0000 | ORAL_TABLET | Freq: Two times a day (BID) | ORAL | 0 refills | Status: AC

## 2023-06-03 NOTE — Progress Notes (Signed)

## 2023-06-03 NOTE — Progress Notes (Signed)
 I have spent 5 minutes in review of e-visit questionnaire, review and updating patient chart, medical decision making and response to patient.   Piedad Climes, PA-C

## 2023-06-04 ENCOUNTER — Other Ambulatory Visit (HOSPITAL_BASED_OUTPATIENT_CLINIC_OR_DEPARTMENT_OTHER): Payer: Self-pay

## 2023-06-04 ENCOUNTER — Ambulatory Visit: Payer: Self-pay | Admitting: Gastroenterology

## 2023-06-04 ENCOUNTER — Other Ambulatory Visit (INDEPENDENT_AMBULATORY_CARE_PROVIDER_SITE_OTHER)

## 2023-06-04 ENCOUNTER — Ambulatory Visit: Admitting: Gastroenterology

## 2023-06-04 ENCOUNTER — Encounter: Payer: Self-pay | Admitting: Gastroenterology

## 2023-06-04 VITALS — BP 134/78 | HR 115 | Ht 71.25 in | Wt 178.0 lb

## 2023-06-04 DIAGNOSIS — Z79899 Other long term (current) drug therapy: Secondary | ICD-10-CM

## 2023-06-04 DIAGNOSIS — K51919 Ulcerative colitis, unspecified with unspecified complications: Secondary | ICD-10-CM

## 2023-06-04 LAB — CBC
HCT: 39.2 % (ref 39.0–52.0)
Hemoglobin: 13.4 g/dL (ref 13.0–17.0)
MCHC: 34.3 g/dL (ref 30.0–36.0)
MCV: 93.3 fl (ref 78.0–100.0)
Platelets: 242 10*3/uL (ref 150.0–400.0)
RBC: 4.2 Mil/uL — ABNORMAL LOW (ref 4.22–5.81)
RDW: 14 % (ref 11.5–15.5)
WBC: 5.5 10*3/uL (ref 4.0–10.5)

## 2023-06-04 LAB — BASIC METABOLIC PANEL WITH GFR
BUN: 11 mg/dL (ref 6–23)
CO2: 28 meq/L (ref 19–32)
Calcium: 9.8 mg/dL (ref 8.4–10.5)
Chloride: 98 meq/L (ref 96–112)
Creatinine, Ser: 0.81 mg/dL (ref 0.40–1.50)
GFR: 105.83 mL/min (ref >60.00)
Glucose, Bld: 129 mg/dL — ABNORMAL HIGH (ref 70–99)
Potassium: 3.8 meq/L (ref 3.5–5.1)
Sodium: 136 meq/L (ref 135–145)

## 2023-06-04 MED ORDER — MESALAMINE 1.2 G PO TBEC
2.4000 g | DELAYED_RELEASE_TABLET | Freq: Every day | ORAL | 3 refills | Status: AC
  Filled 2023-06-04 – 2023-06-09 (×2): qty 180, 90d supply, fill #0
  Filled ????-??-??: fill #0

## 2023-06-04 NOTE — Progress Notes (Signed)
 Chief Complaint:    Ulcerative Colitis, medication refill  GI History: 1. Left sided UC: Colonoscopy Dr. Elsie Halo 2006 up to 30cm. Mesalamine  products did not seem to help very well. Significant flare summer 2009 steroids started. Had extreme hyperglycemia on steroids requiring insulin . Repeat colonoscopy December, 2009 showed inflammation moderate To 70 cm. He was started on sulfasalazine. Had drug rash, likely pneumonitis, LFT abnormalities ( transaminases up to almost 1000, bili up to 6). Holding his medicines resulted in complete resolution of abnormal labs checked serially. TPMT testing January, 2010 showed normal enzyme activity. February, 2010 tapered off all medicines and feels very well. May, 2010: flaring of symptoms for the past month, He went to see a [place] herbalist, did not start the oral mesalamine  that I recommended, restarted prednisone . September, 2010 self-medicating with prednisone  however tapering off. January, 2011, : doing well off traditional IBD medicines, taking Chinese Herb. 05/2011 severe flare, (abd pain, weight loss, bloody diarrhea Hb 6s), required admission, iv steroids, blood transfusion. October 2014: flare of symptoms (had stopped mesalamine  4 months prior); 01/2013 flare improved with prednisone  and restarting lialda . Flared 06/2014, prednisone  20mg  BID. OV 10/2016 doing well on full dose lialda .  Colonoscopy November 2019 for chronic colitis showed normal terminal ileum, colonic mucosa was abnormal throughout.  To have mild active inflammation in the rectosigmoid and burned-out inflammation throughout remaining segments.  Multiple pseudopolyps throughout.  Colon was biopsied extensively.  Biopsies showed no sign of dysplasia and no active or chronic inflammation. Colonoscopy December 2022 normal terminal ileum, mild inflammation and pseudopolyps throughout the colon as well as 20 cm segment of circumferentially severe colitis around the splenic flexure multiple biopsies taken:  Right and left colon biopsies showed chronic damage of the colon, no active inflammation, but the 20 cm segment of severe colitis showed moderately active chronic inflammation. Decided to initiate biologic therapy December 2022 January 2023 quant gold TB test negative, hepatitis C antibody nonreactive, hepatitis B testing suggested immunity Ordered infliximab 5 mg/kg usual loading protocol, his insurance company preferred Avsola rather than Remicade 02/2021 fecal calprotectin 205, CRP elevated, sed rate normal.  Initiated azathioprine  200 mg once daily and he had immediate flulike reaction and this was discontinued with resolution of symptoms 04/2021.  Had follow-up with Dr. Howard Macho and referred to [facility] V[facility]ki [facility] at [facility] for [facility] opinion 10/04/2021: Was seen by [facility] Vickki [facility] at [facility] for [facility].  Calprotectin 329.  CRP normal, normal iron panel (ferritin 12) with mild anemia (H/H 12.5/37.5).  Due to multiple flares over the years with mesalamine  monotherapy requiring steroids, recommended escalation of therapy.  Recommended Entyvio.  Was continued on Lialda  4.8 g/day with recommendation to use Uceris if flare while patient considered escalation of therapy.  Recommended colonoscopy every-2 years. 12/2021: Calprotectin 176, and patient elected to not initiate Entyvio and continued with Lialda  monotherapy   2. swollen, somewhat thrombosed external hemorrhoid July 2013  HPI:     Patient is a 48 y.o. male presenting to the Gastroenterology Clinic for routine follow-up and requesting refill of Lialda .  He was last seen in the GI clinic on 11/27/2021.  Was doing well at that time.  Discussed the recommendation from [facility] clinic to initiate Entyvio.  Last flare was ~05/2020 (characterized as mild flare). Was asymptomatic during colo in 2022 despite active inflammation noted above and elevated fecal calprotectin 2 months later.  Repeat fecal calprotectin was downtrending (176), and  patient ultimately decided against escalation of therapy with periodic inflammatory  panel checked.  Repeat calprotectin last month was normal.  He states he otherwise feels well.  No GI symptoms.  Still taking Lialda  4.8 g daily as prescribed.  Requesting refill today.   Review of systems:     No chest pain, no SOB, no fevers, no urinary sx   Past Medical History:  Diagnosis Date   Blood transfusion without reported diagnosis    Colitis    Crohn's disease ([facility])    Diabetes mellitus without complication ([facility])    Steroid-induced hyperglycemia     Patient's surgical history, family medical history, social history, medications and allergies were all reviewed in Epic    Current Outpatient Medications  Medication Sig Dispense Refill   amoxicillin -clavulanate (AUGMENTIN ) 875-125 MG tablet Take 1 tablet by mouth 2 (two) times daily. 14 tablet 0   Cholecalciferol 125 MCG (5000 UT) TABS Take 1 tablet by mouth daily.     Continuous Blood Gluc Receiver (DEXCOM G7 RECEIVER) DEVI Change sensor every 10 days, on insulin  5 times a day. 1 each 0   Continuous Glucose Sensor (DEXCOM G7 SENSOR) MISC Apply 1 sensor and change every 10 days. 9 each 4   glucose blood test strip Use as directed to check blood sugars 4 times daily 400 each 2   insulin  degludec (TRESIBA  FLEXTOUCH) 100 UNIT/ML FlexTouch Pen Inject 15 units (titrate as directed, max daily dose of 50 units) 45 mL 0   insulin  glargine, 1 Unit Dial , (TOUJEO  SOLOSTAR) 300 UNIT/ML Solostar Pen Inject 16 Units into the skin daily.     insulin  lispro (HUMALOG  KWIKPEN) 100 UNIT/ML KwikPen Inject 5 units for breakfast and 5 units for lunches and 8 to 10 units for evening meal subcutaneous four times a day 90 days 15 mL 11   Insulin  Pen Needle (BD PEN NEEDLE NANO U/F) 32G X 4 MM MISC Use as directed to administer insulin  3 times daily 100 each 11   Insulin  Syringe-Needle U-100 30G X 5/16" 0.3 ML MISC USE 4 TIMES A DAY WITH NOVOLOG  AND LANTUS      Isopropyl  Alcohol (ALCOHOL WIPES) 70 % MISC See admin instructions.     mesalamine  (LIALDA ) 1.2 g EC tablet Take 4 tablets (4.8 g total) by mouth daily. Please keep appointment for 06-04-23 120 tablet 1   Multiple Vitamin (MULTI-VITAMIN) tablet Take 1 tablet by mouth daily.     OVER THE COUNTER MEDICATION CBD Oil.  As needed daily.     TURMERIC PO Take 3 g by mouth daily.     No current facility-administered medications for this visit.    Physical Exam:     BP 134/78 (BP Location: Right Arm, Patient Position: Sitting, Cuff Size: Normal)   Pulse (!) 115   Ht 5' 11.25" (1.81 m)   Wt 178 lb (80.7 kg)   BMI 24.65 kg/m   GENERAL:  Pleasant male in NAD PSYCH: : Cooperative, normal affect Musculoskeletal:  Normal muscle tone, normal strength NEURO: Alert and oriented x 3, no focal neurologic deficits   IMPRESSION and PLAN:    1) Ulcerative Colitis 48 year old male with longstanding history of pan UC, currently in clinical remission and now normal fecal calprotectin.  Interestingly, was feeling well at time of his colonoscopy in 12/2020 but with active inflammation and mildly elevated calprotectin in 02/2021.  He has been intolerant to multiple medications in the past, to include sulfasalazine and Imuran , and has had multiple courses of steroids over the years for various flares.    Now in  clinical and serologic remission on Lialda  monotherapy.  We discussed the value of reducing Lialda  to the lowest effective dose today, and he would like to do so.  Previously discussed Entyvio and other biologic agents, and he would like to continue with current therapy since he is in remission.    - Refill Lialda , but reduce to 2.4 g daily, 90-day supply, RF 5 - If breakthrough symptoms, he will increase back to 4.8 g daily and send me a message - Check BMP and CBC today - If otherwise feeling well at lower dose, plan for repeat calprotectin in 6 months as surrogate marker for response to therapy - Discussed  appropriate timing for repeat colonoscopy to evaluate for deep remission.  Since we are making medication adjustment today, will plan to do in 6+ months depending on response to med adjustment - RTC in 6 months or sooner as needed         Annis Kinder ,DO, FACG 06/04/2023, 1:29 PM

## 2023-06-04 NOTE — Patient Instructions (Addendum)
 _______________________________________________________  If your blood pressure at your visit was 140/90 or greater, please contact your primary care physician to follow up on this.  _______________________________________________________  If you are age 46 or older, your body mass index should be between 23-30. Your Body mass index is 24.65 kg/m. If this is out of the aforementioned range listed, please consider follow up with your Primary Care Provider.  If you are age 73 or younger, your body mass index should be between 19-25. Your Body mass index is 24.65 kg/m. If this is out of the aformentioned range listed, please consider follow up with your Primary Care Provider.   ________________________________________________________  The [facility] providers would like to encourage you to use MYCHART to communicate with providers for non-urgent requests or questions.  Due to long hold times on the telephone, sending your provider a message by [facility] may be a faster and more efficient way to get a response.  Please allow 48 business hours for a response.  Please remember that this is for non-urgent requests.  _______________________________________________________  Your provider has requested that you go to the basement level for lab work before leaving today. Press "B" on the elevator. The lab is located at the first door on the left as you exit the elevator.  Due to recent changes in healthcare laws, you may see the results of your imaging and laboratory studies on MyChart before your provider has had a chance to review them.  We understand that in some cases there may be results that are confusing or concerning to you. Not all laboratory results come back in the same time frame and the provider may be waiting for multiple results in order to interpret others.  Please give us  48 hours in order for your provider to thoroughly review all the results before contacting the office for clarification of  your results.   We have sent the following medications to your pharmacy for you to pick up at your convenience:  DECREASE: Lialda  to 2.4 grams daily  It was a pleasure to see you today!  Vito Cirigliano, D.O.

## 2023-06-09 ENCOUNTER — Encounter (HOSPITAL_BASED_OUTPATIENT_CLINIC_OR_DEPARTMENT_OTHER): Payer: Self-pay | Admitting: Pharmacist

## 2023-06-09 ENCOUNTER — Other Ambulatory Visit (HOSPITAL_BASED_OUTPATIENT_CLINIC_OR_DEPARTMENT_OTHER): Payer: Self-pay

## 2023-06-11 ENCOUNTER — Other Ambulatory Visit (HOSPITAL_BASED_OUTPATIENT_CLINIC_OR_DEPARTMENT_OTHER): Payer: Self-pay

## 2023-06-11 MED ORDER — INSULIN LISPRO (1 UNIT DIAL) 100 UNIT/ML (KWIKPEN)
PEN_INJECTOR | SUBCUTANEOUS | 3 refills | Status: AC
  Filled 2023-06-11: qty 30, 100d supply, fill #0

## 2023-06-16 ENCOUNTER — Other Ambulatory Visit (HOSPITAL_BASED_OUTPATIENT_CLINIC_OR_DEPARTMENT_OTHER): Payer: Self-pay

## 2023-07-08 ENCOUNTER — Other Ambulatory Visit (HOSPITAL_BASED_OUTPATIENT_CLINIC_OR_DEPARTMENT_OTHER): Payer: Self-pay

## 2023-07-25 ENCOUNTER — Other Ambulatory Visit: Payer: Self-pay

## 2023-10-30 ENCOUNTER — Other Ambulatory Visit (HOSPITAL_COMMUNITY): Payer: Self-pay

## 2023-11-10 ENCOUNTER — Other Ambulatory Visit (HOSPITAL_BASED_OUTPATIENT_CLINIC_OR_DEPARTMENT_OTHER): Payer: Self-pay

## 2023-11-21 ENCOUNTER — Ambulatory Visit: Admitting: Physician Assistant

## 2023-12-16 NOTE — Progress Notes (Unsigned)
 12/17/2023 Kenneth [name] [id number] 02/11/1975  Referring provider: Elnor Lauraine BRAVO, NP Primary GI doctor: Dr. San  ASSESSMENT AND PLAN:  Ulcerative colitis diagnosed 2006 Previously elevated fecal calprotectin 2022 which normalized On Lialda  2.4 g daily ( decreased from 4.8g) declined escalation of therapy to Entyvio -Will get prebiologic labs in case there needs to be an escalation -Continue to follow inflammatory markers, recheck today - schedule for screening colonoscopy as well as restaging colonoscopy, he wants to call in new year, we will set up recall to call patient and schedule - refill 2.4 gram daily, if another flare consider 4.8 gram daily versus budesonide versus escalation of therapy - consider DEXA - discussed vaccinations, declines but may consider zoster vacccinations  CCS Screening colonoscopy we will set up recall to call patient and schedule in Jan  Type 2 diabetes insulin -dependent On insulin , secondary to steroids Consider DEXA, declines at this time  History of iron deficiency anemia 06/04/2023  HGB 13.4 MCV 93.3 Platelets 242.0 Recent Labs    06/04/23 1347  HGB 13.4   Vitamin D def Continue vitamin D  Hypertension Monitor with PCP   Patient Care Team: Elnor Lauraine BRAVO, NP as PCP - General (Nurse Practitioner) Patient, No Pcp Per (General Practice)  HISTORY OF PRESENT ILLNESS: 48 y.o. male presents for evaluation of ulcerative colitis. Last seen in the office on 06/04/2023 by Dr. San for refill of Lialda .   Current History Discussed the use of AI scribe software for clinical note transcription with the patient, who gave verbal consent to proceed.  History of Present Illness   Kenneth [name] is a 48 year old male with ulcerative colitis who presents for follow-up and medication management.  He has a history of ulcerative colitis diagnosed in 2006, currently well-controlled with mesalamine  (Lialda ) at a dose of 2.4  grams daily, reduced from 4.8 grams. He experienced a flare in 2022 but has been stable since. He previously tried sulfasalazine but had a severe adverse reaction. He has not required any surgeries but has had blood transfusions in the past. No current abdominal pain, cramping, or blood in stools. Bowel movements are regular, occurring twice in the morning with formed stools. No recent weight loss, nausea, or vomiting.  He is currently on insulin  for diabetes, which began after prolonged steroid use. There is no family history of diabetes. He takes vitamin D regularly and has experienced shingles twice, both times following steroid use.  No joint pain, rashes on legs, or changes in vision. He occasionally experiences a facial rash, which he attributes to a food reaction. He has not received vaccinations since childhood, including the flu or pneumonia vaccines. He had chickenpox as a child.  His blood pressure is usually around 130/82, though it was elevated during this visit, possibly due to stress. He experiences 'white coat syndrome' occasionally.       Inflammatory Bowel Disease History  IBD Medication History  Left sided UC: Colonoscopy Dr. Lennard 2006 up to 30cm. Mesalamine  products did not seem to help very well. Significant flare summer 2009 steroids started. Had extreme hyperglycemia on steroids requiring insulin . Repeat colonoscopy December, 2009 showed inflammation moderate To 70 cm. He was started on sulfasalazine. Had drug rash, likely pneumonitis, LFT abnormalities ( transaminases up to almost 1000, bili up to 6). Holding his medicines resulted in complete resolution of abnormal labs checked serially. TPMT testing January, 2010 showed normal enzyme activity. February, 2010 tapered off all medicines and feels very well.  May, 2010: flaring of symptoms for the past month, He went to see a Chinese herbalist, did not start the oral mesalamine  that I recommended, restarted prednisone . September,  2010 self-medicating with prednisone  however tapering off. January, 2011, : doing well off traditional IBD medicines, taking Chinese Herb. 05/2011 severe flare, (abd pain, weight loss, bloody diarrhea Hb 6s), required admission, iv steroids, blood transfusion. October 2014: flare of symptoms (had stopped mesalamine  4 months prior); 01/2013 flare improved with prednisone  and restarting lialda . Flared 06/2014, prednisone  20mg  BID. OV 10/2016 doing well on full dose lialda .  Colonoscopy November 2019 for chronic colitis showed normal terminal ileum, colonic mucosa was abnormal throughout.  To have mild active inflammation in the rectosigmoid and burned-out inflammation throughout remaining segments.  Multiple pseudopolyps throughout.  Colon was biopsied extensively.  Biopsies showed no sign of dysplasia and no active or chronic inflammation. Colonoscopy December 2022 normal terminal ileum, mild inflammation and pseudopolyps throughout the colon as well as 20 cm segment of circumferentially severe colitis around the splenic flexure multiple biopsies taken: Right and left colon biopsies showed chronic damage of the colon, no active inflammation, but the 20 cm segment of severe colitis showed moderately active chronic inflammation. Decided to initiate biologic therapy December 2022 January 2023 quant gold TB test negative, hepatitis C antibody nonreactive, hepatitis B testing suggested immunity Ordered infliximab 5 mg/kg usual loading protocol, his insurance company preferred Avsola rather than Remicade 02/2021 fecal calprotectin 205, CRP elevated, sed rate normal.  Initiated azathioprine  200 mg once daily and he had immediate flulike reaction and this was discontinued with resolution of symptoms 04/2021.  Had follow-up with Dr. Teressa and referred to Dr. Hayes at [facility] for second opinion 10/04/2021: Was seen by Dr. Hayes at [facility] for second opinion.  Calprotectin 329.  CRP normal, normal iron panel  (ferritin 12) with mild anemia (H/H 12.5/37.5).  Due to multiple flares over the years with mesalamine  monotherapy requiring steroids, recommended escalation of therapy.  Recommended Entyvio.  Was continued on Lialda  4.8 g/day with recommendation to use Uceris if flare while patient considered escalation of therapy.  Recommended colonoscopy every-2 years. 12/2021: Calprotectin 176, and patient elected to not initiate Entyvio and continued with Lialda  monotherapy  Last colonoscopy: 12/2020 colonoscopy normal terminal ileum, mild inflammation and pseudopolyps throughout the colon as well as 20 cm segment of circumferentially severe colitis around the splenic flexure multiple biopsies taken: Right and left colon biopsies showed chronic damage of the colon, no active inflammation, but the 20 cm segment of severe colitis showed moderately active chronic inflammation.  Last endoscopy: none Last Abd CT/CTE/MRE: none Extraintestinal manifestations: The patient has not had any extraintestinal symptoms Surgical history: no surgery.   Review of Data  The following data was reviewed at the time of this encounter:  IBD Labs  Prebiologic Labs    Latest Ref Rng & Units 01/15/2008 02/02/2021   12:40  Biologic Pre-Testing  Hepatitis B Surface Ag NON-REACTIVE  NEGATIVE  NON-REACTIVE   Hepatitis C Ab NON-REACTIVE  NON-REACTIVE   QuantiFERON-TB Gold Plus NEGATIVE  NEGATIVE       Negative test result. M. tuberculosis complex  infection unlikely.      More values are hidden. Newest values shown. Go to activity for more data.    Inflammatory markers    Latest Ref Rng & Units 02/23/2021   12:31 01/09/2022   10:02 04/24/2023   13:40  Inflammatory Markers  Calprotectin mcg/g   37  Reference Range:                                       <50     Normal                                       50-120  Borderline                                       >120     Elevated . Calprotectin in Crohn's disease and ulcerative colitis can be five to several thousand times above the reference population (50 mcg/g or less). Levels are usually 50 mcg/g or less in healthy patients and with irritable bowel syndrome. Repeat testing in 4-6 weeks is suggested for borderline values.   Calprotectin, Fecal 0 - 120 ug/g 204       Concentration     Interpretation   Follow-Up <16 - 50 ug/g     Normal           None >50 -120 ug/g     Borderline       Re-evaluate in 4-6 weeks     >120 ug/g     Abnormal         Repeat as clinically                                    indicated  176       Concentration     Interpretation   Follow-Up < 5 - 50 ug/g     Normal           None >50 -120 ug/g     Borderline       Re-evaluate in 4-6 weeks     >120 ug/g     Abnormal         Repeat as clinically                                    indicated      Laboratory Studies  02/02/2021 TB GOLD NEGATIVE 02/02/2021  HepBsAG NON-REACTIVE  06/04/2023 WBC 5.5 HGB 13.4 MCV 93.3   Micronutrient evaluation:    Latest Ref Rng & Units 07/04/2011   10:20  Vitamins and Micronutrients  Ferritin 22 - 322 ng/mL 23   Iron 42 - 135 ug/dL 10   TIBC 784 - 564 ug/dL 671    Imaging Studies  none Vaccinations:  Immunization History  Administered Date(s) Administered   Tdap 01/15/2012   - Annual Flu Vaccine - declines - Pneumococcal Vaccine (declines - Zoster vaccine if over age 37: consider   RELEVANT GI HISTORY, LABS, IMAGING:  CBC    Component Value Date/Time   WBC 5.5 06/04/2023 1347   RBC 4.20 (L) 06/04/2023 1347   HGB 13.4 06/04/2023 1347   HCT 39.2 06/04/2023 1347   PLT 242.0 06/04/2023 1347   MCV 93.3 06/04/2023 1347   MCH 26.6 07/03/2011 0730   MCHC 34.3 06/04/2023 1347   RDW 14.0 06/04/2023 1347   LYMPHSABS 1.4 05/07/2021 0923   MONOABS 0.6 05/07/2021  0923   EOSABS 0.2 05/07/2021 0923   BASOSABS 0.0 05/07/2021 0923   Recent Labs    06/04/23 1347  HGB 13.4    CMP      Component Value Date/Time   NA 136 06/04/2023 1347   K 3.8 06/04/2023 1347   CL 98 06/04/2023 1347   CO2 28 06/04/2023 1347   GLUCOSE 129 (H) 06/04/2023 1347   BUN 11 06/04/2023 1347   CREATININE 0.81 06/04/2023 1347   CREATININE 0.78 04/15/2019 1421   CALCIUM 9.8 06/04/2023 1347   PROT 7.8 05/07/2021 0923   ALBUMIN 4.3 05/07/2021 0923   AST 32 05/07/2021 0923   ALT 23 05/07/2021 0923   ALKPHOS 57 05/07/2021 0923   BILITOT 0.6 05/07/2021 0923   GFRNONAA 111 04/15/2019 1421   GFRAA 129 04/15/2019 1421      Latest Ref Rng & Units 05/07/2021    9:23 AM 03/30/2021   12:42 PM 04/15/2019    2:21 PM  Hepatic Function  Total Protein 6.0 - 8.3 g/dL 7.8  7.2  7.4   Albumin 3.5 - 5.2 g/dL 4.3  4.1    AST 0 - 37 U/L 32  26  25   ALT 0 - 53 U/L 23  18  18    Alk Phosphatase 39 - 117 U/L 57  57    Total Bilirubin 0.2 - 1.2 mg/dL 0.6  0.4  0.7       Current Medications:   Current Outpatient Medications (Endocrine & Metabolic):    insulin  degludec (TRESIBA  FLEXTOUCH) 100 UNIT/ML FlexTouch Pen, Inject 15 units (titrate as directed, max daily dose of 50 units)   insulin  glargine, 1 Unit Dial , (TOUJEO  SOLOSTAR) 300 UNIT/ML Solostar Pen, Inject 16 Units into the skin daily.   insulin  lispro (HUMALOG  KWIKPEN) 100 UNIT/ML KwikPen, Inject 5 units for breakfast and 5 units for lunches and 8 to 10 units for evening meal subcutaneous four times a day 90 days   insulin  lispro (HUMALOG ) 100 UNIT/ML KwikPen, Inject 3-4 Units into the skin daily with breakfast AND 5-6 Units daily with lunch AND 5-6 Units with mid-day meal AND 6-8 Units daily with supper (Max daily dose of 30 units)      Current Outpatient Medications (Other):    Cholecalciferol 125 MCG (5000 UT) TABS, Take 1 tablet by mouth daily.   Continuous Blood Gluc Receiver (DEXCOM G7 RECEIVER) DEVI, Change sensor every 10 days, on insulin  5 times a day.   Continuous Glucose Sensor (DEXCOM G7 SENSOR) MISC, Apply 1 sensor and change every 10  days.   glucose blood test strip, Use as directed to check blood sugars 4 times daily   Insulin  Pen Needle (BD PEN NEEDLE NANO U/F) 32G X 4 MM MISC, Use as directed to administer insulin  3 times daily   Insulin  Syringe-Needle U-100 30G X 5/16 0.3 ML MISC, USE 4 TIMES A DAY WITH NOVOLOG  AND LANTUS    Isopropyl Alcohol (ALCOHOL WIPES) 70 % MISC, See admin instructions.   mesalamine  (LIALDA ) 1.2 g EC tablet, Take 2 tablets (2.4 g total) by mouth daily.   Multiple Vitamin (MULTI-VITAMIN) tablet, Take 1 tablet by mouth daily.   OVER THE COUNTER MEDICATION, CBD Oil.  As needed daily.   TURMERIC PO, Take 3 g by mouth daily.   amoxicillin -clavulanate (AUGMENTIN ) 875-125 MG tablet, Take 1 tablet by mouth 2 (two) times daily.  Medical History:  Past Medical History:  Diagnosis Date   Blood transfusion without reported diagnosis    Colitis  Crohn's disease (HCC)    Diabetes mellitus without complication (HCC)    Steroid-induced hyperglycemia    Allergies:  Allergies  Allergen Reactions   Azathioprine  Other (See Comments)    Liver failure symtom, flu - like symptoms   Clomiphene      Visual disturbances   Moxifloxacin Other (See Comments)    Liver failure  Other Reaction(s): Unknown   Sulfonamide Derivatives Other (See Comments)    Liver failure     Surgical History:  He  has a past surgical history that includes left thumb surgery (2000); Hernia repair (2010); Wisdom tooth extraction (11/2010); and Colonoscopy (2019). Family History:  His family history includes Cancer - Colon in his mother; GER disease in his father; Heart attack in his paternal grandfather; Heart disease in his paternal grandfather; Hyperlipidemia in his maternal grandmother; Stroke in his maternal grandmother.  REVIEW OF SYSTEMS  : All other systems reviewed and negative except where noted in the History of Present Illness.  PHYSICAL EXAM: BP (!) 164/108   Pulse 77   Ht 6' 1 (1.854 m)   Wt 181 lb 9.6 oz (82.4  kg)   BMI 23.96 kg/m  Physical Exam   GENERAL APPEARANCE: Well nourished, in no apparent distress. HEENT: No cervical lymphadenopathy, unremarkable thyroid , sclerae anicteric, conjunctiva pink. RESPIRATORY: Respiratory effort normal, breath sounds equal bilaterally without rales, rhonchi, or wheezing. Lungs clear to auscultation bilaterally. CARDIO: Regular rate and rhythm with no murmurs, rubs, or gallops, peripheral pulses intact. ABDOMEN: Soft, non-distended, active bowel sounds in all four quadrants, no tenderness to palpation, no rebound, no mass appreciated. RECTAL: Declines. MUSCULOSKELETAL: Full range of motion, normal gait, without edema. SKIN: Dry, intact without rashes or lesions. No jaundice. NEURO: Alert, oriented, no focal deficits. PSYCH: Cooperative, normal mood and affect.        Alan JONELLE Coombs, PA-C 10:44 AM

## 2023-12-17 ENCOUNTER — Other Ambulatory Visit: Payer: Self-pay

## 2023-12-17 ENCOUNTER — Ambulatory Visit: Admitting: Physician Assistant

## 2023-12-17 ENCOUNTER — Encounter: Payer: Self-pay | Admitting: Physician Assistant

## 2023-12-17 VITALS — BP 164/108 | HR 77 | Ht 73.0 in | Wt 181.6 lb

## 2023-12-17 DIAGNOSIS — Z1159 Encounter for screening for other viral diseases: Secondary | ICD-10-CM

## 2023-12-17 DIAGNOSIS — Z111 Encounter for screening for respiratory tuberculosis: Secondary | ICD-10-CM

## 2023-12-17 DIAGNOSIS — E119 Type 2 diabetes mellitus without complications: Secondary | ICD-10-CM

## 2023-12-17 DIAGNOSIS — Z79899 Other long term (current) drug therapy: Secondary | ICD-10-CM

## 2023-12-17 DIAGNOSIS — K51919 Ulcerative colitis, unspecified with unspecified complications: Secondary | ICD-10-CM

## 2023-12-17 DIAGNOSIS — E099 Drug or chemical induced diabetes mellitus without complications: Secondary | ICD-10-CM | POA: Diagnosis not present

## 2023-12-17 DIAGNOSIS — Z794 Long term (current) use of insulin: Secondary | ICD-10-CM | POA: Diagnosis not present

## 2023-12-17 DIAGNOSIS — Z862 Personal history of diseases of the blood and blood-forming organs and certain disorders involving the immune mechanism: Secondary | ICD-10-CM

## 2023-12-17 DIAGNOSIS — R809 Proteinuria, unspecified: Secondary | ICD-10-CM | POA: Diagnosis not present

## 2023-12-17 LAB — BASIC METABOLIC PANEL WITH GFR
BUN: 14 mg/dL (ref 6–23)
CO2: 30 meq/L (ref 19–32)
Calcium: 9.6 mg/dL (ref 8.4–10.5)
Chloride: 98 meq/L (ref 96–112)
Creatinine, Ser: 0.97 mg/dL (ref 0.40–1.50)
GFR: 93.35 mL/min (ref >60.00)
Glucose, Bld: 228 mg/dL — ABNORMAL HIGH (ref 70–99)
Potassium: 4.5 meq/L (ref 3.5–5.1)
Sodium: 139 meq/L (ref 135–145)

## 2023-12-17 LAB — HEPATIC FUNCTION PANEL
ALT: 26 U/L (ref 0–53)
AST: 34 U/L (ref 0–37)
Albumin: 4.4 g/dL (ref 3.5–5.2)
Alkaline Phosphatase: 50 U/L (ref 39–117)
Bilirubin, Direct: 0.1 mg/dL (ref 0.0–0.3)
Total Bilirubin: 0.6 mg/dL (ref 0.2–1.2)
Total Protein: 7.2 g/dL (ref 6.0–8.3)

## 2023-12-17 LAB — CBC WITH DIFFERENTIAL/PLATELET
Basophils Absolute: 0 K/uL (ref 0.0–0.1)
Basophils Relative: 0.5 % (ref 0.0–3.0)
Eosinophils Absolute: 0.1 K/uL (ref 0.0–0.7)
Eosinophils Relative: 3.3 % (ref 0.0–5.0)
HCT: 39.9 % (ref 39.0–52.0)
Hemoglobin: 13.8 g/dL (ref 13.0–17.0)
Lymphocytes Relative: 32.7 % (ref 12.0–46.0)
Lymphs Abs: 1.2 K/uL (ref 0.7–4.0)
MCHC: 34.7 g/dL (ref 30.0–36.0)
MCV: 95.5 fl (ref 78.0–100.0)
Monocytes Absolute: 0.5 K/uL (ref 0.1–1.0)
Monocytes Relative: 13.5 % — ABNORMAL HIGH (ref 3.0–12.0)
Neutro Abs: 1.8 K/uL (ref 1.4–7.7)
Neutrophils Relative %: 50 % (ref 43.0–77.0)
Platelets: 208 K/uL (ref 150.0–400.0)
RBC: 4.18 Mil/uL — ABNORMAL LOW (ref 4.22–5.81)
RDW: 13.4 % (ref 11.5–15.5)
WBC: 3.6 K/uL — ABNORMAL LOW (ref 4.0–10.5)

## 2023-12-17 LAB — C-REACTIVE PROTEIN: CRP: <0.5 mg/dL (ref 0.5–20.0)

## 2023-12-17 LAB — SEDIMENTATION RATE: Sed Rate: 7 mm/h (ref 0–15)

## 2023-12-17 NOTE — Patient Instructions (Signed)
 _______________________________________________________  If your blood pressure at your visit was 140/90 or greater, please contact your primary care physician to follow up on this.  _______________________________________________________  If you are age 46 or older, your body mass index should be between 23-30. Your Body mass index is 23.96 kg/m. If this is out of the aforementioned range listed, please consider follow up with your Primary Care Provider.  If you are age 51 or younger, your body mass index should be between 19-25. Your Body mass index is 23.96 kg/m. If this is out of the aformentioned range listed, please consider follow up with your Primary Care Provider.   ________________________________________________________  The [place] [facility] providers would like to encourage you to use MYCHART to communicate with providers for non-urgent requests or questions.  Due to long hold times on the telephone, sending your provider a message by [facility] may be a faster and more efficient way to get a response.  Please allow 48 business hours for a response.  Please remember that this is for non-urgent requests.  _______________________________________________________  [facility] is using a team-based approach to care.  Your team is made up of your doctor and two to three APPS. Our APPS (Nurse Practitioners and Physician Assistants) work with your physician to ensure care continuity for you. They are fully qualified to address your health concerns and develop a treatment plan. They communicate directly with your gastroenterologist to care for you. Seeing the Advanced Practice Practitioners on your physician's team can help you by facilitating care more promptly, often allowing for earlier appointments, access to diagnostic testing, procedures, and other specialty referrals.   Your provider has requested that you go to the basement level for lab work before leaving today. Press B on the  elevator. The lab is located at the first door on the left as you exit the elevator.  It has been recommended to you by your provider that you have a colonoscopy completed. Per your request, we did not schedule the procedure(s) today. Please contact our office at [phone] should you decide to have the procedure completed. You will be scheduled for a pre-visit and procedure at that time.  Due to recent changes in healthcare laws, you may see the results of your imaging and laboratory studies on MyChart before your provider has had a chance to review them.  We understand that in some cases there may be results that are confusing or concerning to you. Not all laboratory results come back in the same time frame and the provider may be waiting for multiple results in order to interpret others.  Please give us  48 hours in order for your provider to thoroughly review all the results before contacting the office for clarification of your results.

## 2023-12-19 ENCOUNTER — Ambulatory Visit: Payer: Self-pay | Admitting: Physician Assistant

## 2023-12-19 LAB — QUANTIFERON-TB GOLD PLUS
Mitogen-NIL: 7.62 [IU]/mL
NIL: 0.03 [IU]/mL
QuantiFERON-TB Gold Plus: NEGATIVE
TB1-NIL: 0.01 [IU]/mL
TB2-NIL: 0.01 [IU]/mL

## 2023-12-19 LAB — HEPATITIS B SURFACE ANTIGEN: Hepatitis B Surface Ag: NONREACTIVE

## 2024-02-06 ENCOUNTER — Other Ambulatory Visit (HOSPITAL_BASED_OUTPATIENT_CLINIC_OR_DEPARTMENT_OTHER): Payer: Self-pay

## 2024-02-06 MED ORDER — DEXCOM G7 SENSOR MISC
4 refills | Status: AC
  Filled 2024-02-06: qty 9, 90d supply, fill #0

## 2024-02-09 ENCOUNTER — Other Ambulatory Visit (HOSPITAL_BASED_OUTPATIENT_CLINIC_OR_DEPARTMENT_OTHER): Payer: Self-pay
